# Patient Record
Sex: Female | Born: 1950 | Race: White | Hispanic: No | Smoking: Former smoker
Health system: Southern US, Community
[De-identification: ages and names within clinical notes are randomized; demographics above are authoritative.]

## PROBLEM LIST (undated history)

## (undated) DIAGNOSIS — I10 Essential (primary) hypertension: Secondary | ICD-10-CM

## (undated) DIAGNOSIS — E7800 Pure hypercholesterolemia, unspecified: Secondary | ICD-10-CM

## (undated) DIAGNOSIS — I999 Unspecified disorder of circulatory system: Secondary | ICD-10-CM

## (undated) HISTORY — DX: Pure hypercholesterolemia, unspecified: E78.00

## (undated) HISTORY — DX: Essential (primary) hypertension: I10

## (undated) HISTORY — PX: FEMORAL EMBOLOECTOMY: SHX1584

## (undated) HISTORY — PX: ENDARTERECTOMY: SHX5162

## (undated) HISTORY — DX: Unspecified disorder of circulatory system: I99.9

## (undated) HISTORY — PX: ANGIOPLASTY: SHX39

## (undated) HISTORY — PX: OTHER SURGICAL HISTORY: SHX169

---

## 1998-07-15 ENCOUNTER — Other Ambulatory Visit: Admission: RE | Admit: 1998-07-15 | Discharge: 1998-07-15 | Payer: Self-pay | Admitting: Obstetrics and Gynecology

## 1999-07-16 ENCOUNTER — Other Ambulatory Visit: Admission: RE | Admit: 1999-07-16 | Discharge: 1999-07-16 | Payer: Self-pay | Admitting: Obstetrics and Gynecology

## 1999-08-04 ENCOUNTER — Ambulatory Visit (HOSPITAL_COMMUNITY): Admission: RE | Admit: 1999-08-04 | Discharge: 1999-08-04 | Payer: Self-pay | Admitting: Obstetrics and Gynecology

## 1999-08-04 ENCOUNTER — Encounter: Payer: Self-pay | Admitting: Obstetrics and Gynecology

## 2000-08-09 ENCOUNTER — Other Ambulatory Visit: Admission: RE | Admit: 2000-08-09 | Discharge: 2000-08-09 | Payer: Self-pay | Admitting: Obstetrics and Gynecology

## 2001-04-06 ENCOUNTER — Other Ambulatory Visit: Admission: RE | Admit: 2001-04-06 | Discharge: 2001-04-06 | Payer: Self-pay | Admitting: Obstetrics and Gynecology

## 2001-04-06 ENCOUNTER — Encounter (INDEPENDENT_AMBULATORY_CARE_PROVIDER_SITE_OTHER): Payer: Self-pay

## 2003-02-13 ENCOUNTER — Other Ambulatory Visit: Admission: RE | Admit: 2003-02-13 | Discharge: 2003-02-13 | Payer: Self-pay | Admitting: Obstetrics and Gynecology

## 2010-12-05 HISTORY — PX: HIP SURGERY: SHX245

## 2010-12-30 ENCOUNTER — Ambulatory Visit (HOSPITAL_COMMUNITY)
Admission: RE | Admit: 2010-12-30 | Discharge: 2010-12-30 | Payer: Self-pay | Source: Home / Self Care | Attending: Cardiology | Admitting: Cardiology

## 2011-01-10 ENCOUNTER — Ambulatory Visit (HOSPITAL_COMMUNITY)
Admission: RE | Admit: 2011-01-10 | Discharge: 2011-01-10 | Disposition: A | Discharge status: Home / Self Care | Payer: BC Managed Care – PPO | Source: Ambulatory Visit | Attending: Cardiology | Admitting: Cardiology

## 2011-01-10 DIAGNOSIS — I2510 Atherosclerotic heart disease of native coronary artery without angina pectoris: Secondary | ICD-10-CM | POA: Insufficient documentation

## 2011-01-10 DIAGNOSIS — E785 Hyperlipidemia, unspecified: Secondary | ICD-10-CM | POA: Insufficient documentation

## 2011-01-10 DIAGNOSIS — Z87891 Personal history of nicotine dependence: Secondary | ICD-10-CM | POA: Insufficient documentation

## 2011-01-10 DIAGNOSIS — I10 Essential (primary) hypertension: Secondary | ICD-10-CM | POA: Insufficient documentation

## 2011-01-11 ENCOUNTER — Ambulatory Visit (HOSPITAL_COMMUNITY): Admission: RE | Admit: 2011-01-11 | Payer: BC Managed Care – PPO | Source: Ambulatory Visit | Admitting: Cardiology

## 2011-01-20 NOTE — Procedures (Signed)
[name], [name]              ACCOUNT NO.:  [id number]  MEDICAL RECORD NO.:  [id number]           PATIENT TYPE:  O  LOCATION:  [facility]                         FACILITY:  [facility]  PHYSICIAN:  Vonna Kotyk R. Jacinto Halim, MD       DATE OF BIRTH:  Feb 04, 1951  DATE OF PROCEDURE:  01/10/2011 DATE OF DISCHARGE:  01/10/2011                   PERIPHERAL VASCULAR INVASIVE PROCEDURE   REFERRING PHYSICIAN:  Dr. Ardeen Garland.  OPERATION PERFORMED:  Left heart catheterization including, 1. Left ventriculography. 2. Selective right and left coronary arteriography. 3. Abdominal aortogram. 4. Right iliac arteriogram with femoral runoff. 5. Left iliac arteriogram with distal runoff.  INDICATIONS:  [name] is a 60 year old female with history of hypertension, which is very difficult to control, hyperlipidemia, tobacco use which she quit after my office visit 2 weeks ago who has had right foot ulcers.  These have been slowly healing.  Because of symptoms suggestive of severe peripheral arterial disease, she had undergone outpatient Doppler evaluation, which revealed occlusion of right common femoral artery and literally no ABI obtainable on the right leg.  Left lower extremity ABI was 0.7.  Left heart catheterization is being performed to evaluate for coronary artery disease.  Outpatient nuclear stress test had revealed the ejection fraction of 35% without any ischemia.  However, multivessel coronary artery disease was suspected.  HEMODYNAMIC DATA:  The left ventricular pressure was 135/6 with an end- diastolic of 16 mmHg.  Aortic pressure was 135/60 with a mean of 85 mmHg.  There was no pressure gradient across the aortic valve.  ANGIOGRAPHIC DATA: 1. Left ventricle.  Left ventricular systolic function was normal with     the ejection fraction of 55% to 60%.  There was no significant     mitral regurgitation. 2. Right coronary artery.  Right coronary artery is codominant with     left  circumflex coronary artery.  There is a 20% to 30% mid     stenosis.  Right coronary artery is tortuous. 3. Left main coronary artery.  Left main coronary artery is mildly     calcified, but is patent. 4. LAD.  LAD is a large-caliber vessel.  It has got diffuse mild     calcifications.  Again tortuosity was evident. 5. Circumflex coronary artery.  Circumflex coronary artery is     codominant.  The mid segment of the circumflex coronary artery     shows a 40% to 50% stenoses. 6. LAD.  LAD shows again proximal calcification, but no luminal     irregularity was evident.  The LAD is tortuous. 7. Abdominal aortogram.  Abdominal aortogram revealed presence of 2     renal arteries.  Abdominal aorta showed mild calcification.     Aortoiliac bifurcation was widely patent. 8. Right lower extremity arteriogram.  Right lower extremity     arteriogram revealed right iliac artery to be widely patent.  Right     femoral artery is occluded right at the femoral head.  The right     superficial femoral artery constitutes just outside of the high-     dose canal.  There was three-vessel runoff noted below the right  knee. 9. Left femoral artery with distal runoff.  Left femoral artery with     distal runoff revealed the left superficial femoral artery to be     widely patent with mild luminal irregularity.  The popliteal artery     and distal SFA is occluded.  This is a short segment occlusion.     There is extensive collaterals noted.  Below the left knee, there     is three-vessel runoff.  IMPRESSION: 1. No significant coronary artery disease.  Mild-to-moderate amount of     calcification is noted in the proximal coronary vessels.  The     coronary vessels are calcified.  The circumflex femoral artery is     codominant with the right coronary artery and has a mid 40% to 50%     stenosis. 2. Normal left ventricular systolic function, ejection fraction 55% to     60%. 3. Patent renal arteries. 4.  Long segment right common femoral occlusion and the right     superficial femoral artery reconstitutes above just outside of the     high-dose canal. 5. Short segment occlusion of the left distal SFA and left popliteal     artery and this is again at the level of the left knee joint. 6. Three-vessel runoff below the right and left lower extremity.  RECOMMENDATIONS:  The patient will need aggressive continued risk modification.  If she continues to have significant symptoms of claudication or if her ulcers do not heal, then she will need bypass surgery to her right lower extremity that is femoral popliteal bypass on the right.  Left lower extremity claudication can be treated possibly with atherectomy if needed for angioplasty.  TECHNIQUE OF PROCEDURE:  Under sterile precautions, using a 6-French left femoral arterial access, a 5-French Judkins left 4 diagnostic catheter was utilized to engage the left common coronary artery and angiography was performed.  The catheter was then pulled out of the body over a [employer] wire.  A Judkins right 4 diagnostic catheter was utilized to engage the coronary artery and angiography was performed.  The left ventriculography was performed using a pigtail catheter and injecting 25 mL of contrast.  Abdominal aortogram was performed using the same pigtail catheter.  20 mL of contrast was utilized.  Right femoral artery with the distal runoff.  A Omniflush catheter was utilized to place the Foley catheter into the right iliac artery.  The same catheter was also again utilized to a place the catheter in the right common femoral artery.  Right femoral arteriogram with distal runoff was performed.  Then the attention was directed towards the left lower extremity.  Left lower extremity arteriogram was performed using the left femoral access sheath, the tip of which was placed in the left iliac artery.  The catheter exchanges was done over the  [facility] wire.  I did attempt to see if the right femoral artery occlusion can easily be crossed.  I did attempt to poke the right common femoral lesion with a stiff Glidewire, but I was unable to do so.  The lesion was left alone without much attempt as it is a long segment occlusion.  Total of 250 mL of contrast was utilized for diagnostic angiography.     Cristy Hilts. Jacinto Halim, MD     JRG/MEDQ  D:  01/10/2011  T:  01/10/2011  Job:  [id number]  cc:   Ardeen Garland, MD  Electronically Signed by Yates Decamp MD on 01/20/2011 01:27:29 PM

## 2011-01-27 ENCOUNTER — Encounter (INDEPENDENT_AMBULATORY_CARE_PROVIDER_SITE_OTHER): Payer: BC Managed Care – PPO | Admitting: Vascular Surgery

## 2011-01-27 DIAGNOSIS — I7092 Chronic total occlusion of artery of the extremities: Secondary | ICD-10-CM

## 2011-01-28 NOTE — Assessment & Plan Note (Signed)
OFFICE VISIT  [name], [name] DOB:  1951/08/30                                       01/27/2011 EAVWU#:[id number]  The patient is a 60 year old female referred by Dr. Jacinto Halim for complaints of right lower extremity claudication with ulceration in her right foot. She states that she has had sores on her right foot and numbness for several months.  She experiences tightness and pain in her right calf after walking approximately 1 block.  CHRONIC MEDICAL PROBLEMS:  Include hypertension and elevated cholesterol as well as her peripheral arterial disease.  These are currently controlled and followed by Dr. Jacinto Halim.  She denies personal history of diabetes.  PAST SURGICAL HISTORY:  None.  PAST MEDICAL HISTORY:  As listed above.  SOCIAL HISTORY:  She works as a Midwife.  She is single.  She has no children.  She is a current smoker of a half pack a day and has smoked for greater than 20 years.  She states that she has not smoked in the last 3-4 weeks.  She does not consume alcohol regularly.  FAMILY HISTORY:  Remarkable for her mother who had coronary artery disease and myocardial infarction at age 70.  REVIEW OF SYSTEMS:  VASCULAR:  Pain in her legs as mentioned above. GENERAL:  She is 5 feet 7 inches, 140 pounds. ENT:  She has had some decline in her eyesight recently and also wears bilateral hearing aids. All other systems were negative.  Please see intake referral form for details.  MEDICATIONS: 1. Toprol XL 25 mg once a day. 2. Plavix 75 mg once a day. 3. Pitavastatin 2 mg once daily. 4. Tribenzor once daily. 5. Aspirin 325 mg once daily. 6. Keflex 500 mg twice a day. 7. Vitamin D3. 8. Multivitamin. 9. Oxycodone p.r.n. foot pain.  ALLERGIES:  She has no known drug allergies.  PHYSICAL EXAM:  Vital signs:  Blood pressure is 149/85 in the left arm, heart rate is 51 and regular, oxygen saturation 97% on room air.  HEENT: Unremarkable other  than the fact that she wears bilateral hearing aids. Neck:  Has 2+ carotid pulses without bruit.  Chest:  Clear to auscultation.  Cardiac:  Regular rate and rhythm without murmur. Abdomen:  Soft, nontender, nondistended.  No masses.  Musculoskeletal: She has no major obvious joint deformities.  Neurologic:  She has symmetric upper extremity and lower extremity motor strength which is 5/5.  Skin:  The right foot is rubrous from the mid foot out into the toes.  She also has a small ulceration over the right fifth metatarsal head.  Peripheral vascular exam:  She has 2+ radial pulses bilaterally. She has 2+ left femoral pulse.  She has absent right femoral pulse.  She has absent popliteal and pedal pulses in the right leg.  She has a 1+ dorsalis pedis pulse in the left foot.  I reviewed her arteriogram performed by Dr. Yates Decamp.  This shows a right common femoral artery occlusion with some reconstitution of the distal profunda femoris artery.  She also has reconstitution of the above knee popliteal artery with three vessel runoff to the right foot. Left lower extremity she has a 50% stenosis of the left popliteal artery.  Otherwise her vasculature is intact.  I believe the best option for the patient at this point would be a right femoral endarterectomy,  possible right femoral to above knee popliteal bypass to heal up the ulcer on the right foot.  She might also possibly require femoral-femoral bypass if I am unable to reopen the occluded right common femoral artery.  All these procedures were discussed with the patient today including but not limited to bleeding, infection, myocardial risk, thrombosis of the grafts long-term, possible limb loss. She understands and agrees to proceed.  This is scheduled for 02/02/2011.    Janetta Hora. Fields, MD Electronically Signed  CEF/MEDQ  D:  01/27/2011  T:  01/28/2011  Job:  [id number]  cc:   Cristy Hilts. Jacinto Halim, MD Ardeen Garland, MD

## 2011-02-01 ENCOUNTER — Other Ambulatory Visit: Payer: Self-pay | Admitting: Vascular Surgery

## 2011-02-01 ENCOUNTER — Ambulatory Visit (HOSPITAL_COMMUNITY)
Admission: RE | Admit: 2011-02-01 | Discharge: 2011-02-01 | Disposition: A | Discharge status: Home / Self Care | Payer: BC Managed Care – PPO | Source: Ambulatory Visit | Attending: Vascular Surgery | Admitting: Vascular Surgery

## 2011-02-01 ENCOUNTER — Encounter (HOSPITAL_COMMUNITY)
Admission: RE | Admit: 2011-02-01 | Discharge: 2011-02-01 | Disposition: A | Discharge status: Home / Self Care | Payer: BC Managed Care – PPO | Source: Ambulatory Visit | Attending: Vascular Surgery | Admitting: Vascular Surgery

## 2011-02-01 DIAGNOSIS — I10 Essential (primary) hypertension: Secondary | ICD-10-CM | POA: Insufficient documentation

## 2011-02-01 DIAGNOSIS — I739 Peripheral vascular disease, unspecified: Secondary | ICD-10-CM

## 2011-02-01 DIAGNOSIS — Z01810 Encounter for preprocedural cardiovascular examination: Secondary | ICD-10-CM | POA: Insufficient documentation

## 2011-02-01 DIAGNOSIS — Z01812 Encounter for preprocedural laboratory examination: Secondary | ICD-10-CM | POA: Insufficient documentation

## 2011-02-01 DIAGNOSIS — Z87891 Personal history of nicotine dependence: Secondary | ICD-10-CM | POA: Insufficient documentation

## 2011-02-01 LAB — SURGICAL PCR SCREEN
MRSA, PCR: NEGATIVE
Staphylococcus aureus: NEGATIVE

## 2011-02-01 LAB — COMPREHENSIVE METABOLIC PANEL
AST: 21 U/L (ref 0–37)
BUN: 17 mg/dL (ref 6–23)
CO2: 30 mEq/L (ref 19–32)
Calcium: 10.2 mg/dL (ref 8.4–10.5)
Chloride: 100 mEq/L (ref 96–112)
Creatinine, Ser: 1.04 mg/dL (ref 0.4–1.2)
GFR calc non Af Amer: 54 mL/min — ABNORMAL LOW (ref >60)
Glucose, Bld: 90 mg/dL (ref 70–99)
Total Bilirubin: 1 mg/dL (ref 0.3–1.2)

## 2011-02-01 LAB — CBC
Platelets: 358 10*3/uL (ref 150–400)
RBC: 4.53 MIL/uL (ref 3.87–5.11)
RDW: 12.9 % (ref 11.5–15.5)
WBC: 8.9 10*3/uL (ref 4.0–10.5)

## 2011-02-01 LAB — URINE MICROSCOPIC-ADD ON

## 2011-02-01 LAB — URINALYSIS, ROUTINE W REFLEX MICROSCOPIC
Bilirubin Urine: NEGATIVE
Nitrite: NEGATIVE
Specific Gravity, Urine: 1.013 (ref 1.005–1.030)
Urobilinogen, UA: 0.2 mg/dL (ref 0.0–1.0)
pH: 7 (ref 5.0–8.0)

## 2011-02-01 LAB — TYPE AND SCREEN
ABO/RH(D): O POS
Antibody Screen: NEGATIVE

## 2011-02-02 ENCOUNTER — Other Ambulatory Visit: Payer: Self-pay | Admitting: Vascular Surgery

## 2011-02-02 ENCOUNTER — Inpatient Hospital Stay (HOSPITAL_COMMUNITY)
Admission: RE | Admit: 2011-02-02 | Discharge: 2011-02-04 | DRG: 479 | Disposition: A | Discharge status: Home / Self Care | Payer: BC Managed Care – PPO | Source: Ambulatory Visit | Attending: Vascular Surgery | Admitting: Vascular Surgery

## 2011-02-02 DIAGNOSIS — F17200 Nicotine dependence, unspecified, uncomplicated: Secondary | ICD-10-CM | POA: Diagnosis present

## 2011-02-02 DIAGNOSIS — I739 Peripheral vascular disease, unspecified: Secondary | ICD-10-CM | POA: Diagnosis present

## 2011-02-02 DIAGNOSIS — I743 Embolism and thrombosis of arteries of the lower extremities: Secondary | ICD-10-CM

## 2011-02-02 DIAGNOSIS — E785 Hyperlipidemia, unspecified: Secondary | ICD-10-CM | POA: Diagnosis present

## 2011-02-02 DIAGNOSIS — I10 Essential (primary) hypertension: Secondary | ICD-10-CM | POA: Diagnosis present

## 2011-02-02 DIAGNOSIS — I959 Hypotension, unspecified: Secondary | ICD-10-CM | POA: Diagnosis present

## 2011-02-02 DIAGNOSIS — Z7982 Long term (current) use of aspirin: Secondary | ICD-10-CM

## 2011-02-02 DIAGNOSIS — I498 Other specified cardiac arrhythmias: Secondary | ICD-10-CM | POA: Diagnosis present

## 2011-02-02 DIAGNOSIS — E7800 Pure hypercholesterolemia, unspecified: Secondary | ICD-10-CM | POA: Diagnosis present

## 2011-02-02 DIAGNOSIS — I4891 Unspecified atrial fibrillation: Secondary | ICD-10-CM | POA: Diagnosis present

## 2011-02-03 DIAGNOSIS — Z48812 Encounter for surgical aftercare following surgery on the circulatory system: Secondary | ICD-10-CM

## 2011-02-03 DIAGNOSIS — I739 Peripheral vascular disease, unspecified: Secondary | ICD-10-CM

## 2011-02-03 LAB — BASIC METABOLIC PANEL
BUN: 11 mg/dL (ref 6–23)
CO2: 27 mEq/L (ref 19–32)
Calcium: 9 mg/dL (ref 8.4–10.5)
Glucose, Bld: 107 mg/dL — ABNORMAL HIGH (ref 70–99)
Potassium: 3.7 mEq/L (ref 3.5–5.1)
Sodium: 137 mEq/L (ref 135–145)

## 2011-02-03 LAB — CBC
HCT: 29.9 % — ABNORMAL LOW (ref 36.0–46.0)
Hemoglobin: 10 g/dL — ABNORMAL LOW (ref 12.0–15.0)
MCHC: 33.4 g/dL (ref 30.0–36.0)
MCV: 84.5 fL (ref 78.0–100.0)

## 2011-02-03 LAB — HEPARIN LEVEL (UNFRACTIONATED): Heparin Unfractionated: <0.1 IU/mL — ABNORMAL LOW (ref 0.30–0.70)

## 2011-02-04 LAB — PROTEIN C ACTIVITY: Protein C Activity: 142 % — ABNORMAL HIGH (ref 75–133)

## 2011-02-04 LAB — LUPUS ANTICOAGULANT PANEL
PTT Lupus Anticoagulant: 47.7 secs — ABNORMAL HIGH (ref 30.0–45.6)
PTTLA 4:1 Mix: 42.6 secs (ref 30.0–45.6)

## 2011-02-04 LAB — HOMOCYSTEINE: Homocysteine: 12 umol/L (ref 4.0–15.4)

## 2011-02-06 LAB — PROTEIN S, TOTAL: Protein S Ag, Total: 97 % (ref 70–140)

## 2011-02-07 NOTE — Discharge Summary (Addendum)
[name], [name]              ACCOUNT NO.:  [id number]  MEDICAL RECORD NO.:  [id number]           PATIENT TYPE:  I  LOCATION:  2039                         FACILITY:  [facility]  PHYSICIAN:  Janetta Hora. Fields, MD  DATE OF BIRTH:  July 25, 1951  DATE OF ADMISSION:  02/02/2011 DATE OF DISCHARGE:  02/04/2011                              DISCHARGE SUMMARY   CHIEF COMPLAINT:  Right lower extremity claudication with ulceration in the right foot.  HISTORY OF PRESENT ILLNESS:  [name] was referred to [place] by Dr. Jacinto Halim regarding complaints of right lower extremity claudication and ulceration of the right foot.  She states she had sores on the right foot and numbness for several months.  She experienced tightness and pain in her right calf after walking approximately one block.  Angiogram performed by Dr. Jacinto Halim showed a right common femoral artery occlusion, some reconstitution of distal profunda femoris artery.  She also had reconstitution of the above-knee popliteal artery with three-vessel runoff to the right foot.  On the left lower extremity, she had a 50% stenosis of the left popliteal artery.  She was admitted to the hospital for a right common femoral artery and external iliac endarterectomy, right common femoral embolectomy with bovine patch angioplasty.  PAST MEDICAL HISTORY: 1. Hypertension. 2. Hypercholesterolemia. 3. Peripheral vascular disease. 4. She denies any coronary artery disease or diabetes. 5. She has a positive history of smoking 1/2 a pack a day for greater     than 20 years.  HOSPITAL COURSE:  The patient was taken to the operating room on February 02, 2011, for a right common femoral and external iliac artery endarterectomies with right common femoral embolectomy and with bovine patch angioplasty.  Postoperatively, the patient did well.  Her foot was warm and pink.  She was started on Pradaxa and taken off her Plavix. She will continue on her aspirin.  Her  laboratory values remained stable.  Her ABIs showed 0.62 on the right, 0.85 on the left; and she remained stable from both a cardiac and vascular standpoint.  She was up and about walking without difficulty.  Foot much improved and she had referral for an outpatient orthotist to custom make a shoe to relieve any pressure on the source of her foot.  She will be discharged to home and follow up with Dr. Darrick Penna in 2-3 weeks and she will follow up with Dr. Jacinto Halim regarding her Pradaxa and aspirin regimen.  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  She is status post a right common femoral and right external iliac endarterectomies with bovine patch angioplasty and embolectomy of her right common femoral artery.  She had a hypercoagulable workup sent which is still pending.  The patient was begun on Pradaxa and 81 mg of aspirin to prevent further embolus.  She had some PACs, but no atrial fib.  DISPOSITION:  The patient was discharged to home.  She will follow up with Dr. Darrick Penna in 2-3 weeks and with Dr. Jacinto Halim in 2 weeks.  DISCHARGE MEDICATIONS: 1. Pradaxa 150 mg twice daily. 2. Oxycodone 5 mg 1-2 tablets every  4 hours as needed for pain, she     was given 30. 3. Aspirin 81 mg daily. 4. Livalo 2 mg daily. 5. Multivitamin daily. 6. Toprol-XL mg daily. 7. Tribenzor 10/12.5/40 mg daily. 8. Vitamin D by mouth daily.     Della Goo, PA-C   ______________________________ Janetta Hora Fields, MD    RR/MEDQ  D:  02/04/2011  T:  02/05/2011  Job:  [id number]  Electronically Signed by Fabienne Bruns MD on 02/07/2011 11:03:35 AM Electronically Signed by Della Goo PA on 02/07/2011 02:42:23 PM

## 2011-02-07 NOTE — Op Note (Signed)
[name], [name]              ACCOUNT NO.:  [id number]  MEDICAL RECORD NO.:  [id number]           PATIENT TYPE:  I  LOCATION:  3303                         FACILITY:  [facility]  PHYSICIAN:  Janetta Hora. Orlin Kann, MD  DATE OF BIRTH:  Oct 08, 1951  DATE OF PROCEDURE:  02/02/2011 DATE OF DISCHARGE:                              OPERATIVE REPORT   PROCEDURES: 1. Right femoral embolectomy 2. Endarterectomy of right external iliac and common femoral artery. 3. Right profundoplasty. 4. Patch angioplasty of right femoral artery.  PREOPERATIVE DIAGNOSIS:  Nonhealing ulcer, right foot.  POSTOPERATIVE DIAGNOSIS:  Nonhealing ulcer, right foot.  ANESTHESIA:  General.  SURGEON:  Jimia Gentles E. Makaia Rappa, MD  ASSISTANT:  Della Goo, PA-C  OPERATIVE FINDINGS: 1. Chronic embolus, right femoral artery. 2. Chronic occlusion, right superficial femoral artery. 3. Bovine pericardial patch, right femoral artery.  OPERATIVE DETAILS:  After obtaining informed consent, the patient was taken to the operating room.  The patient was placed in supine position on the operating room table.  The patient's entire right lower extremity was prepped and draped in the usual sterile fashion as well as the left groin and thigh area.  This was all done after induction of general anesthesia and endotracheal intubation.  Next, a longitudinal incision was made in the right groin, carried down through the subcutaneous tissues down to the level of the right common femoral artery.  The femoral artery was densely adherent to the surrounding structures and dissection of this was fairly tedious.  There seemed to be inflammatory reaction around this.  There was no pulse within the right femoral artery.  Preoperative arteriogram had showed occlusion of the distal right external iliac artery as well as the right common femoral artery with reconstitution of the above-knee popliteal artery.  The profunda femoris and superficial  femoral arteries were dissected free circumferentially and vessel loops were placed around these. Common femoral artery was dissected free circumferentially.  To get to an area of the artery where there was a reasonable pulse, this required fairly extensive dissection up underneath the right inguinal ligament. Approximately 2 cm of the right inguinal ligament was taken down with cautery.  I was then able to get up to the level of the circumflex iliac branches, all of which were ligated and divided between the silk ties. Several centimeters above the inguinal ligament, the external iliac artery was normal in appearance and had a good quality pulse.  At this point, the patient was given 7000 units of intravenous heparin.  The patient was given an additional 2000 units of heparin during the course of the case.  A longitudinal opening was then made after occluding the right common femoral artery with a Cooley clamp and the profunda and superficial femoral arteries with vessel loops.  There was adherent thrombus within the femoral artery.  The arteriotomy was extended several centimeters in the common femoral artery and then the thrombus was removed under direct vision.  This was very chronic and was adherent to the arterial wall.  Next, a #4 Fogarty catheter was used to thrombectomized the inflow through the external iliac artery.  More clot  was returned.  Several representative pieces of this were sent to pathology as a specimen.  The #4 and #5 Fogarty catheters were used and multiple passes were made up the inflow system until two clean passes were obtained.  This was then re-occluded with the Cooley clamp. Attention was then turned to the distal femoral artery.  The clot again was very adherent, there was not really any focal calcification or plaque; however, the clot was adherent to the wall of the artery.  This required extending the arteriotomy down into the profunda femoris artery.  I  was then able to pass a #3 Fogarty down into two large branches of the profunda and again this was passed until two clean passes were obtained, much of this clot was actually removed under direct vision again because it was so adherent to the wall of the artery.  There was good backbleeding from the profunda femoris at this time and this was thoroughly flushed with heparinized saline and re- occluded with a baby Gregory clamp.  Attempts were then made to thrombectomized the superficial femoral artery.  I was able to get a #4 Fogarty catheter to pass 20 cm down into the superficial femoral artery and returned some clot; however, there was no backbleeding from the artery.  The arteriogram had the appearance of a chronic SFA occlusion distally.  At this point, all loose debris was removed from the femoral artery.  I also did a right femoral endarterectomy and endarterectomized the part of the external iliac artery, so that there was a nice smooth surface as the intima had been fairly severely damaged by the adherent clot.  After all loose debris was removed, the bovine pericardial patch was brought up in the operative field and this was used so on as a patch angioplasty extending from the distal external iliac artery all the way down several centimeters onto the right profunda femoris artery.  Just prior to completion of the anastomosis, everything was forebled, backbled and thoroughly flushed.  Anastomosis was secured.  Clamps were released.  There was good pulsatile flow in the femoral artery immediately.  There was good biphasic-to-triphasic Doppler flow in the profunda femoris artery.  There was monophasic flow in the right superficial femoral artery.  The patient had good biphasic dorsalis pedis and monophasic posterior tibial Doppler signals at this point. Hemostasis was obtained with addition of two repair stitches in the patch.  Next, the groin was closed in multiple layers of running  2-0 and 3-0 Vicryl suture.  The inguinal ligament was repaired with several 2-0 Vicryl sutures.  Subcutaneous tissues were reapproximated using running 3-0 Vicryl suture.  The skin was closed with a 4-0 Vicryl subcuticular stitch and Dermabond applied.  The patient tolerated the procedure well and there were no complications.  Instrument, sponge, and needle counts were correct at the end of the case.  The patient was taken to the recovery room in stable condition.     Janetta Hora. Keithen Capo, MD     CEF/MEDQ  D:  02/02/2011  T:  02/03/2011  Job:  [id number]  Electronically Signed by Fabienne Bruns MD on 02/07/2011 11:03:29 AM

## 2011-02-08 LAB — BETA-2-GLYCOPROTEIN I ABS, IGG/M/A
Beta-2 Glyco I IgG: 37 G Units — ABNORMAL HIGH (ref <20)
Beta-2-Glycoprotein I IgA: 11 A Units (ref <20)

## 2011-02-08 LAB — CARDIOLIPIN ANTIBODIES, IGG, IGM, IGA: Anticardiolipin IgA: 5 APL U/mL — ABNORMAL LOW (ref <22)

## 2011-02-17 ENCOUNTER — Encounter: Payer: BC Managed Care – PPO | Admitting: Vascular Surgery

## 2011-02-17 ENCOUNTER — Ambulatory Visit (INDEPENDENT_AMBULATORY_CARE_PROVIDER_SITE_OTHER): Payer: BC Managed Care – PPO | Admitting: Vascular Surgery

## 2011-02-17 DIAGNOSIS — L98499 Non-pressure chronic ulcer of skin of other sites with unspecified severity: Secondary | ICD-10-CM

## 2011-02-17 DIAGNOSIS — I739 Peripheral vascular disease, unspecified: Secondary | ICD-10-CM

## 2011-02-18 NOTE — Assessment & Plan Note (Signed)
OFFICE VISIT  ANGLA, DELAHUNT DOB:  [birth year]                                       02/17/2011 EAVWU#:[id number]  The patient returns for followup today for a wound check in her right groin.  She was apparently seen by Dr. Jacinto Halim a few days ago and he had some concerns whether or not she may have infection in there.  She denies any fever or chills.  She denies any drainage from the wound. She is currently not on antibiotics.  PHYSICAL EXAM:  Blood pressure is 162/92 in the left arm, heart rate 66 and regular.  Right groin incision is healing well except for the very central portion which has some maceration where the skin edges have been rubbing together.  There is no significant erythema.  There is no purulent drainage.  There is no mass in this area.  Her right foot is pink and warm.  She does still have an ulcer over the fifth metatarsal head which is occasionally painful to her but it does appear like this is drying up.  In summary, I believe the patient's wound is continuing to heal well at this point.  I do not believe she needs antibiotic therapy or any incision and drainage of her right groin incision.  I did counsel her today in keeping this dry and washing this once daily with soap and water.  She will follow up next week for further followup.  Will also obtain ABIs at that time to make sure that she does have adequate perfusion at this point to heal up her right foot ulcer.    Janetta Hora. Fields, MD Electronically Signed  CEF/MEDQ  D:  02/17/2011  T:  02/18/2011  Job:  [id number]  cc:   Cristy Hilts. Jacinto Halim, MD

## 2011-02-24 ENCOUNTER — Ambulatory Visit (INDEPENDENT_AMBULATORY_CARE_PROVIDER_SITE_OTHER): Payer: BC Managed Care – PPO | Admitting: Vascular Surgery

## 2011-02-24 ENCOUNTER — Ambulatory Visit: Payer: Self-pay | Admitting: Vascular Surgery

## 2011-02-24 ENCOUNTER — Encounter: Payer: BC Managed Care – PPO | Admitting: Vascular Surgery

## 2011-02-24 ENCOUNTER — Encounter (INDEPENDENT_AMBULATORY_CARE_PROVIDER_SITE_OTHER): Payer: BC Managed Care – PPO

## 2011-02-24 DIAGNOSIS — I739 Peripheral vascular disease, unspecified: Secondary | ICD-10-CM

## 2011-02-24 DIAGNOSIS — I743 Embolism and thrombosis of arteries of the lower extremities: Secondary | ICD-10-CM

## 2011-02-24 DIAGNOSIS — Z48812 Encounter for surgical aftercare following surgery on the circulatory system: Secondary | ICD-10-CM

## 2011-02-25 NOTE — Assessment & Plan Note (Signed)
OFFICE VISIT  HAIZEL, GATCHELL DOB:  1951/07/24                                       02/24/2011 WUXLK#:[id number]  The patient returns for followup today.  She recently had thrombectomy and femoral endarterectomy on the right side.  She returns today for a wound check.  She also returns today to check to see what the perfusion in her right leg is at this point.  She states that she still has some mild pain in her right foot but this continues to improve.  The ulcer on her right fifth metatarsal has also continued to heal.  She is planning on returning to work next week.  She has had no significant drainage from the right groin.  PHYSICAL EXAM:  Vital signs:  Blood pressure is 182/80 in the left arm, heart rate 70 and regular.  Temperature is 97.9.  Right groin incision is continuing to heal well.  There is no drainage.  There is no erythema.  The right foot is pink and warm.  The ulceration over the right fifth metatarsal head is 2 cm in diameter.  It is dry.  There is no tenderness.  There is no purulent drainage from this.  Her ABIs were rechecked today and she has gone from an ABI on the right side of 0 to 0.58.  Left ABI was 0.89.  I would favor continued conservative management.  She should have adequate perfusion now to heal the right foot.  We will give her another 6 weeks to heal this.  If it has not healed at that time or she still is experiencing difficulty in the right foot we would consider whether or not further revascularization in the right leg is required.  She will continue to do dry dressings on the right groin as needed.    Janetta Hora. Fields, MD Electronically Signed  CEF/MEDQ  D:  02/24/2011  T:  02/25/2011  Job:  [id number]  cc:   Cristy Hilts. Jacinto Halim, MD

## 2011-02-28 NOTE — Consult Note (Signed)
[name], [name]              ACCOUNT NO.:  [id number]  MEDICAL RECORD NO.:  [id number]          PATIENT TYPE:  INP  LOCATION:                               FACILITY:  [facility]  PHYSICIAN:  Vonna Kotyk R. Jacinto Halim, MD       DATE OF BIRTH:  10/30/1951  DATE OF CONSULTATION:  02/01/2011 DATE OF DISCHARGE:                                CONSULTATION   REASON FOR CONSULTATION:  Please evaluate SVT, possible atrial fibrillation, and atrial tachycardia.  HISTORY:  [name] is a pleasant 60 year old female with past history of difficult to control hypertension, tobacco use, which she quit in January 2012, underwent lower extremity arteriogram, which revealed long segment occlusion of the right external iliac artery, common femoral artery, and also long segment occlusion of the right superficial femoral artery.  She had ulcerations in the right foot hence with a limb threatening ischemia.  She was referred for Vascular consultation with Dr. Fabienne Bruns for evaluation of possible aortofemoral bypass surgery.  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  Dr. Fabienne Bruns was able to successfully perform thrombectomy of the external iliac and common femoral artery along with profunda femoral artery and she underwent patch angioplasty of the same. Because of presence of thrombotic occlusion, I was asked to plan on presence of frequent atrial ectopic rhythm and possible consideration for anticoagulation.  Presently, the patient denies any complaints.  She states that her feet feels warm for the first time.  She denies any chest pain, shortness of breath, paroxysmal nocturnal dyspnea, or orthopnea.  REVIEW OF SYSTEMS:  She has not had any significant bowel or bladder disturbances.  No fever.  No nausea or vomiting.  She is not a diabetic. She has remained abstinent from smoking.   Other systems are negative.  PRESENT MEDICATIONS:  Includes labetalol and hydralazine for p.r.n. basis for hypertension.  ALLERGIES:  No known drug allergies.  PAST MEDICAL HISTORY:  Significant for hypertension, hyperlipidemia, and peripheral arterial disease.  PAST SURGICAL HISTORY:  None apart from what happened today.  SOCIAL HISTORY:  She is single.  She does not drink alcohol.  She has quit smoking since January 2012.  FAMILY HISTORY:  There is no history of premature coronary artery disease in the family.  PHYSICAL EXAM:  GENERAL:  She is moderately built, well nourished, appears to be in no acute distress. Vital Signs:  Include temperature of 97.7, pulse is 70 beats per minute, respirations 12, blood pressure 147/76 mmHg. CARDIAC:  S1 and S2 is normal without any gallop or murmur. CHEST:  Clear. ABDOMEN:  Soft.  Her surgical site appears to have no obvious ooze. EXTREMITIES:  Her right foot is warm, dry.  There is full range of movement.  There is no edema.  The other 3 extremities are normal.  Her telemetry evaluation revealed presence of frequent atrial ectopics, but without any evidence of SVT.  Her EKG demonstrates sinus rhythm, poor R-wave progression, left ventricular hypertrophy with repolarization abnormality, unchanged from prior EKG done in the office.  IMPRESSION: 1. 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  Status post successful thrombectomy and Dacron patch     angioplasty of the external iliac and common femoral artery by Dr.     Fabienne Bruns on February 02, 2011. 2. Frequent premature atrial contractions on the monitor, but without     any evidence of documented atrial fibrillation.  By echocardiogram     that was done recently in our office had revealed left atrial size     to be mildly enlarged, which can be attributed to her marked      difficult to control hypertension.  This was done on December 29, 2002.  She has normal ejection fraction and normal coronary     arteries. 3. Hypertension, which is difficult to control.  RECOMMENDATIONS:  I agree with Dr. Darrick Penna either Pradaxa or Xarelto would be appropriate antithrombotic agent for at least 8 to 12 weeks until stable or until possible close observation for paroxysmal atrial fibrillation is ruled out.  I would probably start IV heparin only and agree with Dr. Eather Colas orders, and in 24 to 48 hours if there is no bleeding complication, then start direct thrombin inhibitor.  I would also recommend restarting all her cardiac medications as her hypertension is extremely difficult to control.  She has extremely difficult to control hypertension, hence would like to stop her previous medications.  Thank you for consultation.  I will continue to follow the patient along with you.  I greatly appreciate your care and concern for the patient.     Cristy Hilts. Jacinto Halim, MD     JRG/MEDQ  D:  02/02/2011  T:  02/03/2011  Job:  [id number]  Electronically Signed by Yates Decamp MD on 02/28/2011 09:51:06 AM

## 2011-03-21 ENCOUNTER — Encounter (HOSPITAL_BASED_OUTPATIENT_CLINIC_OR_DEPARTMENT_OTHER): Payer: BC Managed Care – PPO | Attending: Internal Medicine

## 2011-03-21 DIAGNOSIS — I10 Essential (primary) hypertension: Secondary | ICD-10-CM | POA: Insufficient documentation

## 2011-03-21 DIAGNOSIS — Z79899 Other long term (current) drug therapy: Secondary | ICD-10-CM | POA: Insufficient documentation

## 2011-03-21 DIAGNOSIS — Z7982 Long term (current) use of aspirin: Secondary | ICD-10-CM | POA: Insufficient documentation

## 2011-03-21 DIAGNOSIS — I739 Peripheral vascular disease, unspecified: Secondary | ICD-10-CM | POA: Insufficient documentation

## 2011-03-21 DIAGNOSIS — L97509 Non-pressure chronic ulcer of other part of unspecified foot with unspecified severity: Secondary | ICD-10-CM | POA: Insufficient documentation

## 2011-03-24 ENCOUNTER — Other Ambulatory Visit: Payer: Self-pay | Admitting: Otolaryngology

## 2011-03-30 ENCOUNTER — Other Ambulatory Visit: Payer: Self-pay

## 2011-03-31 ENCOUNTER — Ambulatory Visit (INDEPENDENT_AMBULATORY_CARE_PROVIDER_SITE_OTHER): Payer: BC Managed Care – PPO | Admitting: Vascular Surgery

## 2011-03-31 DIAGNOSIS — I743 Embolism and thrombosis of arteries of the lower extremities: Secondary | ICD-10-CM

## 2011-04-01 NOTE — Assessment & Plan Note (Signed)
OFFICE VISIT  [name], [name] DOB:  03/31/1951                                       03/31/2011 [id number]#:[id number]  The patient returns for followup today.  She had thrombectomy and femoral endarterectomy on the right side February 29.  She returns today for further followup.  She does not really have any claudication symptoms at this point.  She still has some pain and an ulceration over the right fifth metatarsal head.  Her groin incision is completely healed at this point.  PHYSICAL EXAM:  Blood pressure is 180/98 in the left arm, heart rate is 90 and regular.  Right groin incision is well-healed.  She has a 2 mm ulceration over the right lateral fifth malleolus.  Foot is pink, warm and well-perfused.  Previous postop ABIs on the right side indicated an ABI of 0.58.  This should be adequate for wound healing.  However, the ulcer has been persistent.  She is currently going to the wound care center and states that some progress has been made and the wound is apparently healing.  I believe the best option at this point is to see if this wound will continue to heal with conservative measures alone.  She will follow up with me in 6 weeks' time.  If the ulcer has not completely healed at that time she will need an arteriogram to reevaluate her right lower extremity and consider whether further revascularization is necessary.    Janetta Hora. Davaughn Hillyard, MD Electronically Signed  CEF/MEDQ  D:  03/31/2011  T:  04/01/2011  Job:  [id number]  cc:   Cristy Hilts. Jacinto Halim, MD

## 2011-04-07 ENCOUNTER — Ambulatory Visit: Payer: BC Managed Care – PPO | Admitting: Vascular Surgery

## 2011-04-11 ENCOUNTER — Encounter (HOSPITAL_BASED_OUTPATIENT_CLINIC_OR_DEPARTMENT_OTHER): Payer: BC Managed Care – PPO | Attending: Internal Medicine

## 2011-04-11 DIAGNOSIS — Z7982 Long term (current) use of aspirin: Secondary | ICD-10-CM | POA: Insufficient documentation

## 2011-04-11 DIAGNOSIS — I739 Peripheral vascular disease, unspecified: Secondary | ICD-10-CM | POA: Insufficient documentation

## 2011-04-11 DIAGNOSIS — Z79899 Other long term (current) drug therapy: Secondary | ICD-10-CM | POA: Insufficient documentation

## 2011-04-11 DIAGNOSIS — L97509 Non-pressure chronic ulcer of other part of unspecified foot with unspecified severity: Secondary | ICD-10-CM | POA: Insufficient documentation

## 2011-04-11 DIAGNOSIS — I10 Essential (primary) hypertension: Secondary | ICD-10-CM | POA: Insufficient documentation

## 2011-04-18 ENCOUNTER — Other Ambulatory Visit (HOSPITAL_BASED_OUTPATIENT_CLINIC_OR_DEPARTMENT_OTHER): Payer: Self-pay | Admitting: Internal Medicine

## 2011-04-18 ENCOUNTER — Ambulatory Visit (HOSPITAL_COMMUNITY)
Admission: RE | Admit: 2011-04-18 | Discharge: 2011-04-18 | Disposition: A | Discharge status: Home / Self Care | Payer: BC Managed Care – PPO | Source: Ambulatory Visit | Attending: Internal Medicine | Admitting: Internal Medicine

## 2011-04-18 DIAGNOSIS — I998 Other disorder of circulatory system: Secondary | ICD-10-CM | POA: Insufficient documentation

## 2011-04-18 DIAGNOSIS — L97509 Non-pressure chronic ulcer of other part of unspecified foot with unspecified severity: Secondary | ICD-10-CM | POA: Insufficient documentation

## 2011-04-18 DIAGNOSIS — M79609 Pain in unspecified limb: Secondary | ICD-10-CM | POA: Insufficient documentation

## 2011-04-18 DIAGNOSIS — M79673 Pain in unspecified foot: Secondary | ICD-10-CM

## 2011-04-18 DIAGNOSIS — M19079 Primary osteoarthritis, unspecified ankle and foot: Secondary | ICD-10-CM | POA: Insufficient documentation

## 2011-04-19 NOTE — Assessment & Plan Note (Signed)
[facility]  NAME:  [name], [name]              ACCOUNT NO.:  [id number]  MEDICAL RECORD NO.:  [id number]      DATE OF BIRTH:  [birth year]  PHYSICIAN:  Jonelle Sports. Klea Nall, M.D.  VISIT DATE:  03/21/2011                                  OFFICE VISIT   HISTORY:  This 60 year old white female is referred from the cardiac vascular surgeons for assistance with management of the wound on the lateral distal right foot at approximately the MP joint area.  This wound has been present since September last year and led to extensive evaluation which showed right superficial femoral artery occlusion and some abnormalities.  Eventually, she underwent embolectomy of that extremity and some angioplasty as well.  She initially appeared to have a good result but that was not sustained.  It was the hope of the vascular surgeons that this wound would nonetheless heal with careful attention and that if it had not healed within 6 weeks or so that consideration of bypass surgery would need to be undertaken. Accordingly, she appears here for our evaluation and care.  The procedure referred to above was done by Dr. Fabienne Bruns on February 02, 2011, and included right femoral embolectomy, endarterectomy of the right external iliac and common femoral artery, right profundoplasty, and a patch angioplasty of the right femoral artery.  PAST MEDICAL HISTORY:  Surgery only that as indicated above.  Other hospitalizations none.  REGULAR MEDICATIONS: 1. Pradaxa 150 mg every 12 hours. 2. Aspirin 81 mg daily. 3. Livalo 2 mg daily. 4. Multivitamin daily. 5. Tribenzor 10/40/12.5 daily. 6. Vitamin D 5000 units daily. 7. Cilostazol 100 mg twice daily.  She has no known medicinal allergies.  FAMILY HISTORY:  Positive for congestive heart failure, high blood pressure, and heart attack, as well as diabetes.  PERSONAL HISTORY:  The patient is single, lives alone, able to take care of all her  personal needs.  She quit smoking in late January 2012, but had smoked for a number of years prior to that.  She denies use of alcohol or recreational drugs.  She is employed by [employer] and [employer].  REVIEW OF SYSTEMS:  The patient has no diabetes, no history of cancer, has never received radiation or chemotherapy, has normal renal function, has never been considered for dialysis.  She of course has known peripheral artery disease as indicated above.  She denied any history associated with her history of smoking and indeed had a normal chest x- ray at the time of her procedure.  She has had extensive cardiovascular evaluation, and on the bottom line her coronary artery status is thought to be quite normal and her left ventricular ejection fraction 50%.  She has never had a stroke or any other neurologic symptomatology.  PHYSICAL EXAMINATION:  Blood pressure is 142/82, pulse 82 with occasional PC, respirations 19, temperature 98.0.  She is a thin, alert, cooperative, late middle-aged white female in no immediate distress. Her mucous membranes are moist.  Her color is normal.  She has no carotid bruits.  Her neck veins are flat.  Her chest is grossly clear to auscultation and percussion.  Her heart tones are normal with an occasional PC.  There is no definite murmur or gallop.  Her abdomen is scaphoid without organomegaly,  masses, or tenderness.  Her extremities are free of edema.  Distal pulses are not easily palpable, and there is some dependent rubor in both feet.  On the lateral aspect of her right foot proximally overlying adjacent to the metatarsal head is a small ulcer measuring 0.6 x 0.5 x 0.2 cm which is quite tender to touch but which does not appear infected and has no drainage or other striking features.  IMPRESSION:  Peripheral vascular disease with ischemic ulcer lateral aspect right foot.  DISPOSITION:  The wound today requires minimal debridement of small amount of  slough in its base.  It was then treated with an application of silver collagen covered by Tielle dressing, and the patient was instructed to change this 3 days hence and then to appear again for reevaluation in 1 week.          ______________________________ Jonelle Sports. Cheryll Cockayne, M.D.     RES/MEDQ  D:  04/18/2011  T:  04/19/2011  Job:  [id number]

## 2011-05-12 ENCOUNTER — Ambulatory Visit (INDEPENDENT_AMBULATORY_CARE_PROVIDER_SITE_OTHER): Payer: BC Managed Care – PPO | Admitting: Vascular Surgery

## 2011-05-12 DIAGNOSIS — I7092 Chronic total occlusion of artery of the extremities: Secondary | ICD-10-CM

## 2011-05-13 NOTE — Assessment & Plan Note (Signed)
OFFICE VISIT  [name], [name] DOB:  1951-02-25                                       05/12/2011 [id number]#:[id number]  The patient returns for followup today.  She previously underwent an extensive right external iliac common femoral endarterectomy with profundoplasty and femoral embolectomy with patch angioplasty of her right femoral artery on 02/02/2011.  She has been still experiencing some mild claudication symptoms after the operation.  She also has still had some difficulty healing up a small ulceration on her right foot. She was discharged from the wound center recently but the ulcer has recently broken down somewhat.  She currently is experiencing calf claudication in the right side after walking 7 minutes on a treadmill. She denies rest pain.  CHRONIC MEDICAL PROBLEMS:  Remain hypertension, elevated cholesterol and peripheral arterial disease.  These are all currently controlled and followed by Dr. Jacinto Halim.  PAST SURGICAL HISTORY:  As listed above.  She is currently not smoking.  PHYSICAL EXAM:  Vital signs:  Blood pressure is 174/95 in the left arm, heart rate 82 and regular, respirations 20.  Lower extremities:  She has 2+ femoral pulses bilaterally.  She has absent popliteal and pedal pulses in the right foot.  She has a 2 mm ulcer on the dorsum of her right second toe which is healing somewhat.  I reviewed her ABIs from 03/01/2011 which were 0.58 on the right with monophasic waveforms and 0.89 on the left with triphasic to monophasic waveforms.  I believe at this point we need to repeat the arteriogram on the patient and consider angioplasty and stenting of her right superficial femoral artery or consideration for a right leg bypass procedure if necessary. I believe she needs this due to her disabling claudication symptoms and continued breakdown of the ulceration on her right foot.  We have scheduled for her arteriogram on 06/03/2011.  We  will stop her Pradaxa on 05/27/2011.  Risks, benefits, possible complications and procedure details were explained to the patient today including but not limited to bleeding, infection, vessel injury, contrast reaction and she understands and agrees to proceed.    Janetta Hora. Fields, MD Electronically Signed  CEF/MEDQ  D:  05/13/2011  T:  05/13/2011  Job:  [id number]  cc:   Pamella Pert, MD

## 2011-05-17 ENCOUNTER — Ambulatory Visit
Admission: RE | Admit: 2011-05-17 | Discharge: 2011-05-17 | Disposition: A | Discharge status: Home / Self Care | Payer: BC Managed Care – PPO | Source: Ambulatory Visit | Attending: Otolaryngology | Admitting: Otolaryngology

## 2011-05-17 MED ORDER — GADOBENATE DIMEGLUMINE 529 MG/ML IV SOLN
14.0000 mL | Freq: Once | INTRAVENOUS | Status: AC | PRN
  Administered 2011-05-17: 14 mL via INTRAVENOUS

## 2011-06-03 ENCOUNTER — Ambulatory Visit (HOSPITAL_COMMUNITY)
Admission: RE | Admit: 2011-06-03 | Discharge: 2011-06-03 | Disposition: A | Discharge status: Home / Self Care | Payer: BC Managed Care – PPO | Source: Ambulatory Visit | Attending: Vascular Surgery | Admitting: Vascular Surgery

## 2011-06-03 DIAGNOSIS — I70209 Unspecified atherosclerosis of native arteries of extremities, unspecified extremity: Secondary | ICD-10-CM | POA: Insufficient documentation

## 2011-06-03 DIAGNOSIS — I70219 Atherosclerosis of native arteries of extremities with intermittent claudication, unspecified extremity: Secondary | ICD-10-CM

## 2011-06-03 DIAGNOSIS — Z01810 Encounter for preprocedural cardiovascular examination: Secondary | ICD-10-CM | POA: Insufficient documentation

## 2011-06-03 LAB — POCT I-STAT, CHEM 8
Creatinine, Ser: 1.5 mg/dL — ABNORMAL HIGH (ref 0.50–1.10)
Hemoglobin: 11.9 g/dL — ABNORMAL LOW (ref 12.0–15.0)
Sodium: 136 mEq/L (ref 135–145)
TCO2: 24 mmol/L (ref 0–100)

## 2011-06-20 ENCOUNTER — Other Ambulatory Visit (HOSPITAL_COMMUNITY): Payer: Self-pay | Admitting: Obstetrics and Gynecology

## 2011-06-20 DIAGNOSIS — R1900 Intra-abdominal and pelvic swelling, mass and lump, unspecified site: Secondary | ICD-10-CM

## 2011-06-20 NOTE — Op Note (Signed)
  [name], [name]              ACCOUNT NO.:  [id number]  MEDICAL RECORD NO.:  [id number]  LOCATION:  [facility]                         FACILITY:  [facility]  PHYSICIAN:  Janetta Hora. Fields, MD  DATE OF BIRTH:  November 14, 1951  DATE OF PROCEDURE:  06/03/2011 DATE OF DISCHARGE:  06/03/2011                              OPERATIVE REPORT   PROCEDURE:  Aortogram with right lower extremity runoff.  PREOPERATIVE DIAGNOSIS:  Claudication.  POSTOPERATIVE DIAGNOSIS:  Claudication.  ANESTHESIA:  Local with IV sedation.  OPERATIVE DETAILS:  After obtaining informed consent, the patient was taken to the peripheral vascular lab.  The patient was placed in supine position on the angio table.  Both groins were prepped and draped in usual sterile fashion.  Local anesthesia was infiltrated over the left common femoral artery.  An introducer needle was used to cannulate the left common femoral artery and a 0.035 Versacore wire threaded in the abdominal aorta under fluoroscopic guidance.  Next, a 5-French sheath was placed over the guidewire in the left common femoral artery and thoroughly flushed with heparinized saline.  A 5-French pigtail catheter was then placed over the guidewire in the abdominal aorta, and abdominal aortogram obtained.  This shows bilateral single renal arteries, which are widely patent.  The infrarenal abdominal aorta is patent.  There was some calcification of the iliac vessels, but these are patent with no evidence of stenosis in the common iliac, internal iliac, and external iliac artery.  Next, the 5-French pigtail catheter was removed over a guidewire, and a 5-French crossover catheter brought up on the operative field and used to selectively catheterize the right common iliac artery. A right lower extremity arteriogram was obtained.  This shows a small right external iliac artery.  There is a 40% narrowing of the proximal common femoral artery.  The area of previous femoral  endarterectomy was widely patent.  The right superficial femoral artery is occluded at its origin.  The profunda femoris artery is widely patent.  The above-knee popliteal artery reconstitutes above the knee via profunda collaterals. There is three-vessel runoff to the right foot, however, runoff is primarily by the peroneal and posterior tibial arteries.  There is some skip lesions of subtotal occlusion of the mid right anterior tibial artery.  Next, the 5-French straight catheter, which had been advanced all the way down to the distal right external iliac artery was removed over a guidewire.  The 5-French sheath was left in to be pulled in the holding area.  The patient tolerated the procedure well.  There were no complications.  Plan will be to perform a right femoral to above-knee popliteal bypass in approximately 1 month as is the scar tissue from the right femoral endarterectomy has completely resolved.     Janetta Hora. Fields, MD     CEF/MEDQ  D:  06/03/2011  T:  06/04/2011  Job:  [id number]  Electronically Signed by Fabienne Bruns MD on 06/20/2011 09:52:10 AM

## 2011-06-23 ENCOUNTER — Encounter: Payer: Self-pay | Admitting: Vascular Surgery

## 2011-06-23 ENCOUNTER — Ambulatory Visit (HOSPITAL_COMMUNITY): Admission: RE | Admit: 2011-06-23 | Payer: BC Managed Care – PPO | Source: Ambulatory Visit

## 2011-06-24 ENCOUNTER — Ambulatory Visit (HOSPITAL_COMMUNITY)
Admission: RE | Admit: 2011-06-24 | Discharge: 2011-06-24 | Disposition: A | Discharge status: Home / Self Care | Payer: BC Managed Care – PPO | Source: Ambulatory Visit | Attending: Obstetrics and Gynecology | Admitting: Obstetrics and Gynecology

## 2011-06-24 ENCOUNTER — Encounter (HOSPITAL_COMMUNITY): Payer: Self-pay | Admitting: Anesthesiology

## 2011-06-24 DIAGNOSIS — R1900 Intra-abdominal and pelvic swelling, mass and lump, unspecified site: Secondary | ICD-10-CM

## 2011-06-24 DIAGNOSIS — N9489 Other specified conditions associated with female genital organs and menstrual cycle: Secondary | ICD-10-CM | POA: Insufficient documentation

## 2011-06-24 MED ORDER — IOHEXOL 300 MG/ML  SOLN
80.0000 mL | Freq: Once | INTRAMUSCULAR | Status: AC | PRN
  Administered 2011-06-24: 80 mL via INTRAVENOUS

## 2011-07-14 ENCOUNTER — Ambulatory Visit: Payer: BC Managed Care – PPO | Admitting: Vascular Surgery

## 2013-01-09 IMAGING — CR DG FOOT COMPLETE 3+V*R*
3 series · 3 of 3 positions shown · non-contrast
Comparison: None.

CLINICAL DATA: Foot pain.  Osteomyelitis.Distal fifth metatarsal
ulceration.

RIGHT FOOT COMPLETE - 3+ VIEW

[t foot ap right]
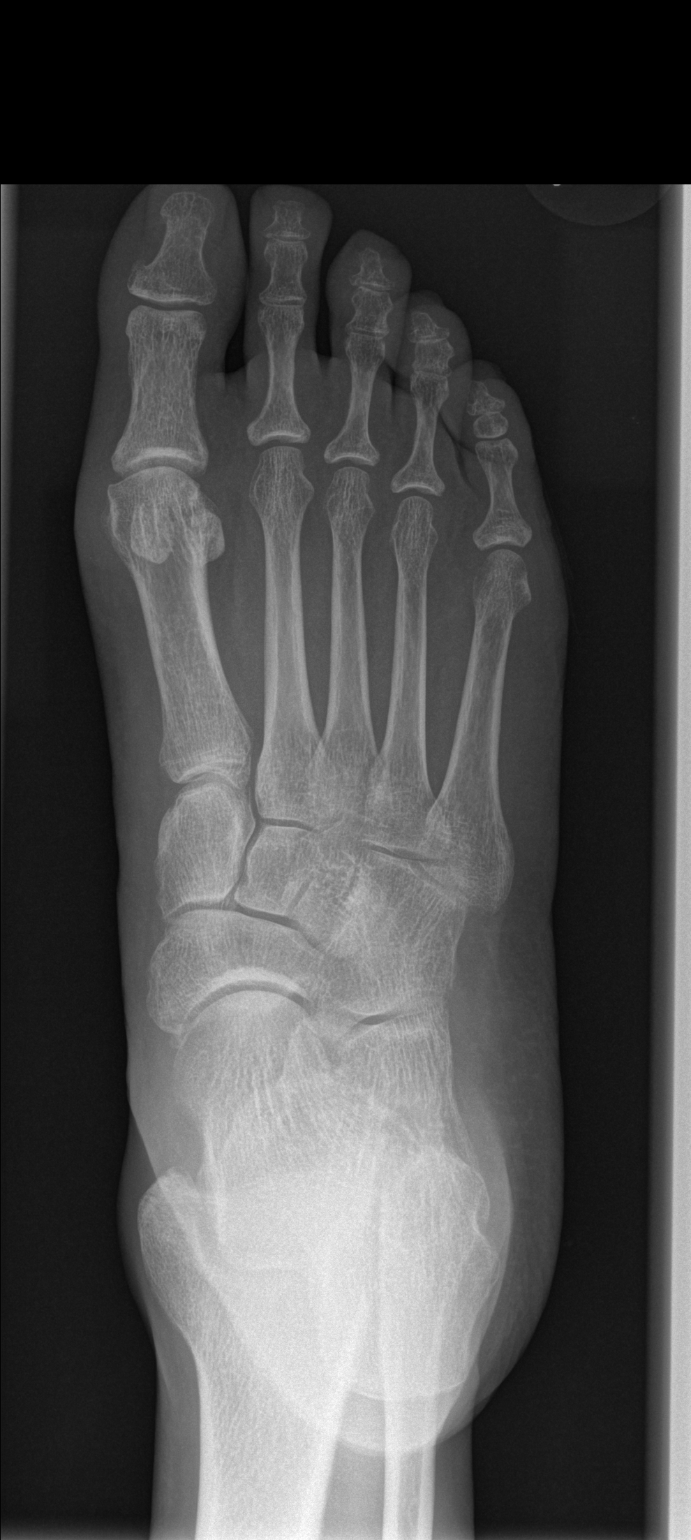

[t foot oblique right]
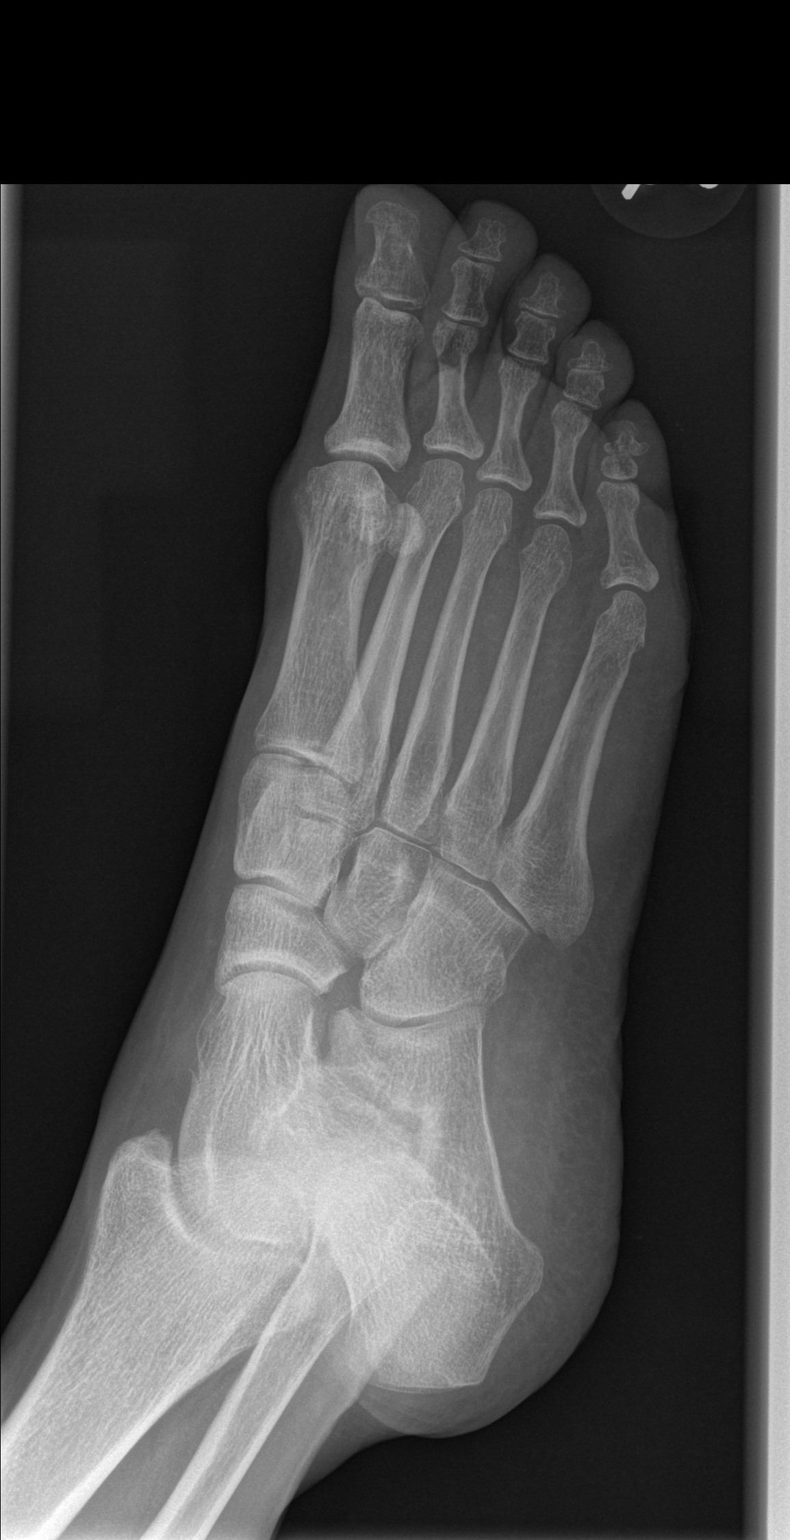

[t foot lat right]
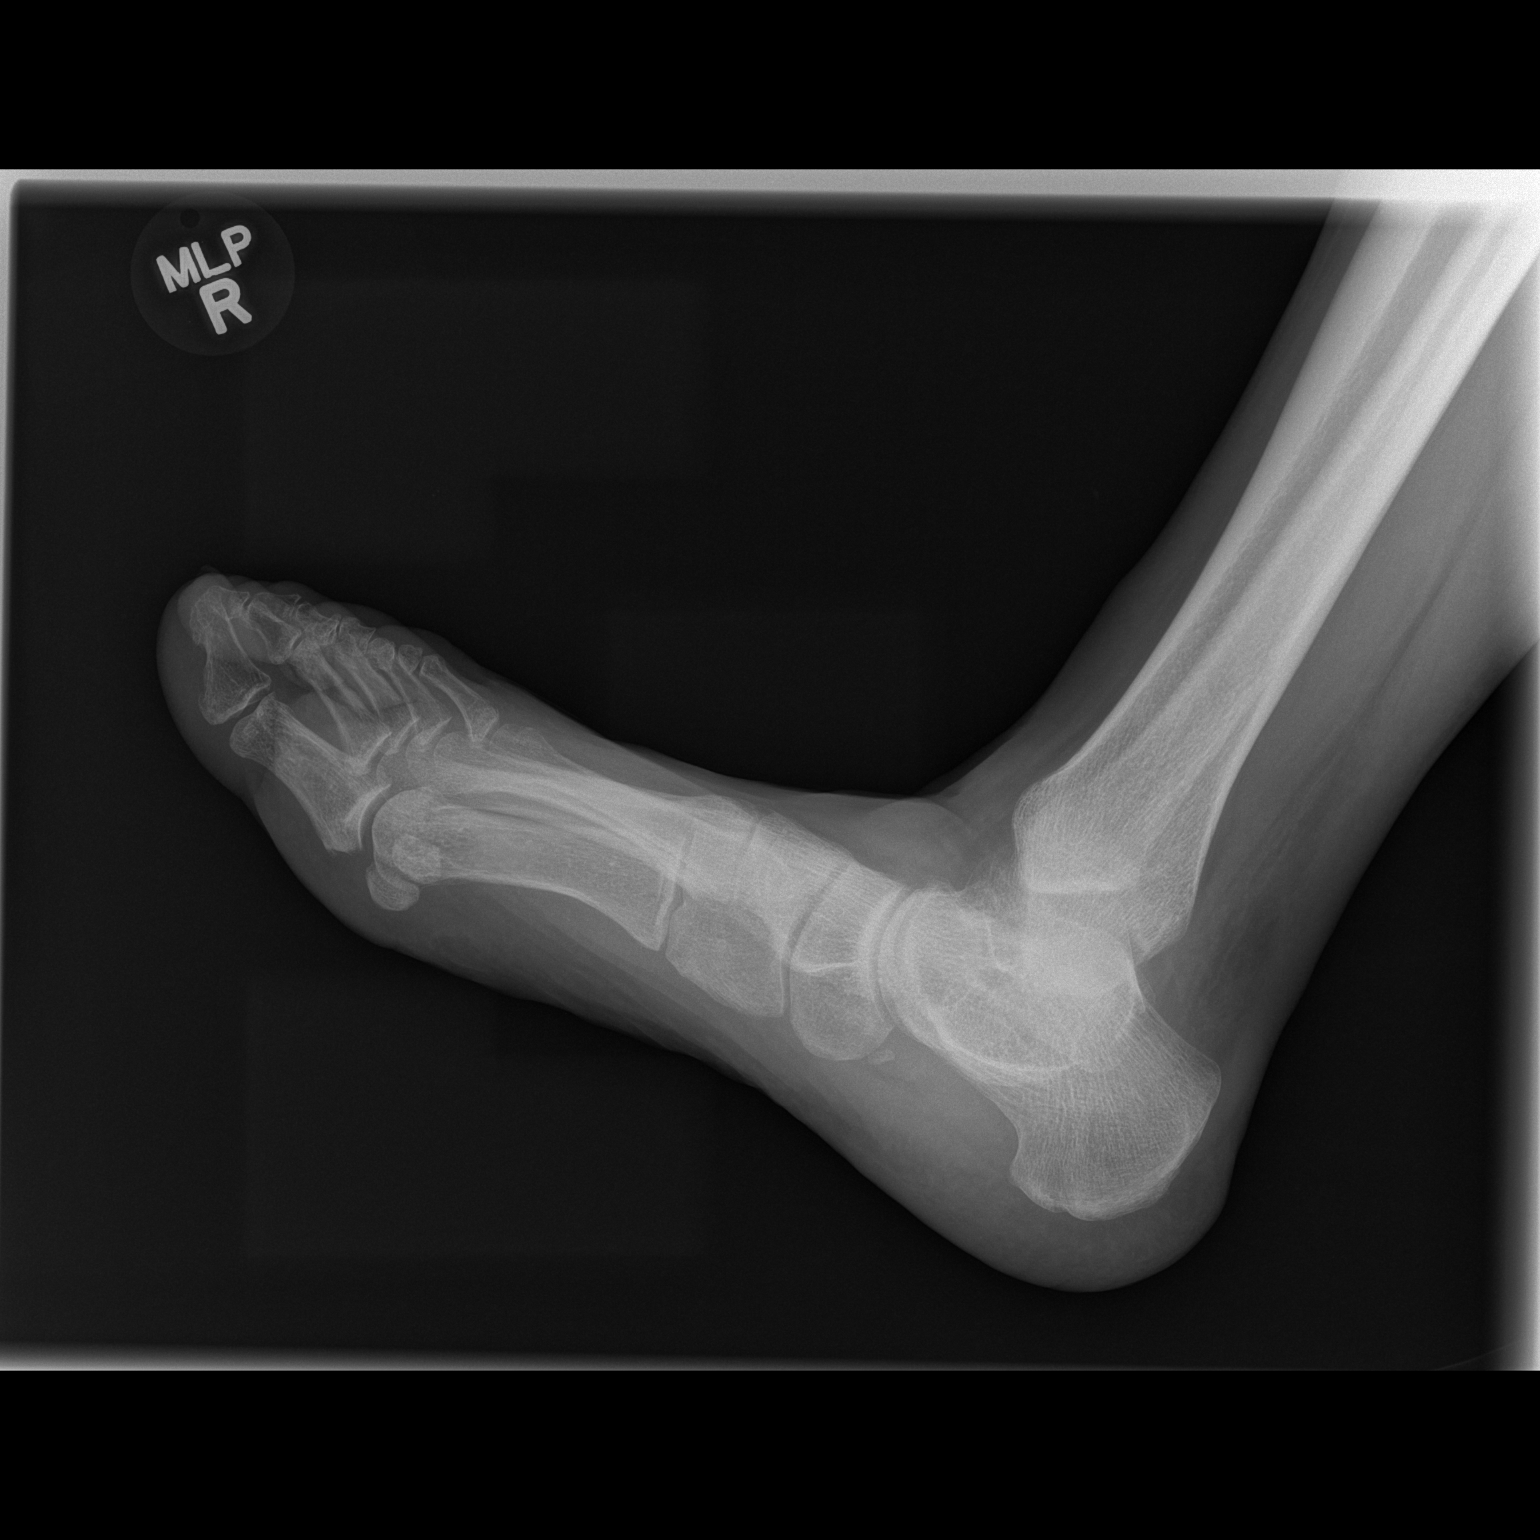

[3 of 3 positions shown; findings below may reference images not displayed]

FINDINGS: There is a small ulceration lateral to the right fifth
metatarsal neck.  There is no plain film evidence of osteomyelitis.
No fracture.  Mild first MTP joint osteoarthritis.  Alignment of
the bones of the foot is anatomic. The lateral view is oblique.
IMPRESSION: No acute osseous abnormality or evidence of osteomyelitis. Soft
tissue ulceration lateral to the fifth metatarsal neck.

## 2018-05-27 ENCOUNTER — Observation Stay (HOSPITAL_COMMUNITY): Payer: Medicare Other

## 2018-05-27 ENCOUNTER — Encounter (HOSPITAL_COMMUNITY): Payer: Self-pay | Admitting: Emergency Medicine

## 2018-05-27 ENCOUNTER — Emergency Department (HOSPITAL_COMMUNITY): Payer: Medicare Other

## 2018-05-27 ENCOUNTER — Inpatient Hospital Stay (HOSPITAL_COMMUNITY)
Admission: EM | Admit: 2018-05-27 | Discharge: 2018-05-31 | DRG: 897 | Disposition: A | Discharge status: Home / Self Care | Payer: Medicare Other | Attending: Internal Medicine | Admitting: Internal Medicine

## 2018-05-27 DIAGNOSIS — Z8279 Family history of other congenital malformations, deformations and chromosomal abnormalities: Secondary | ICD-10-CM

## 2018-05-27 DIAGNOSIS — N179 Acute kidney failure, unspecified: Secondary | ICD-10-CM

## 2018-05-27 DIAGNOSIS — F10239 Alcohol dependence with withdrawal, unspecified: Principal | ICD-10-CM | POA: Diagnosis present

## 2018-05-27 DIAGNOSIS — E785 Hyperlipidemia, unspecified: Secondary | ICD-10-CM | POA: Diagnosis present

## 2018-05-27 DIAGNOSIS — N183 Chronic kidney disease, stage 3 (moderate): Secondary | ICD-10-CM | POA: Diagnosis present

## 2018-05-27 DIAGNOSIS — Z833 Family history of diabetes mellitus: Secondary | ICD-10-CM

## 2018-05-27 DIAGNOSIS — Z87891 Personal history of nicotine dependence: Secondary | ICD-10-CM

## 2018-05-27 DIAGNOSIS — Z8249 Family history of ischemic heart disease and other diseases of the circulatory system: Secondary | ICD-10-CM

## 2018-05-27 DIAGNOSIS — E7800 Pure hypercholesterolemia, unspecified: Secondary | ICD-10-CM | POA: Diagnosis present

## 2018-05-27 DIAGNOSIS — Z803 Family history of malignant neoplasm of breast: Secondary | ICD-10-CM

## 2018-05-27 DIAGNOSIS — Z7982 Long term (current) use of aspirin: Secondary | ICD-10-CM

## 2018-05-27 DIAGNOSIS — R443 Hallucinations, unspecified: Secondary | ICD-10-CM | POA: Diagnosis present

## 2018-05-27 DIAGNOSIS — J449 Chronic obstructive pulmonary disease, unspecified: Secondary | ICD-10-CM | POA: Diagnosis present

## 2018-05-27 DIAGNOSIS — E538 Deficiency of other specified B group vitamins: Secondary | ICD-10-CM | POA: Diagnosis present

## 2018-05-27 DIAGNOSIS — R4182 Altered mental status, unspecified: Secondary | ICD-10-CM | POA: Diagnosis present

## 2018-05-27 DIAGNOSIS — K7010 Alcoholic hepatitis without ascites: Secondary | ICD-10-CM | POA: Diagnosis present

## 2018-05-27 DIAGNOSIS — I129 Hypertensive chronic kidney disease with stage 1 through stage 4 chronic kidney disease, or unspecified chronic kidney disease: Secondary | ICD-10-CM | POA: Diagnosis present

## 2018-05-27 DIAGNOSIS — I739 Peripheral vascular disease, unspecified: Secondary | ICD-10-CM | POA: Diagnosis present

## 2018-05-27 DIAGNOSIS — R945 Abnormal results of liver function studies: Secondary | ICD-10-CM

## 2018-05-27 DIAGNOSIS — Z23 Encounter for immunization: Secondary | ICD-10-CM

## 2018-05-27 DIAGNOSIS — H9190 Unspecified hearing loss, unspecified ear: Secondary | ICD-10-CM | POA: Diagnosis present

## 2018-05-27 DIAGNOSIS — E876 Hypokalemia: Secondary | ICD-10-CM | POA: Diagnosis present

## 2018-05-27 LAB — URINALYSIS, ROUTINE W REFLEX MICROSCOPIC
BILIRUBIN URINE: NEGATIVE
Bacteria, UA: NONE SEEN
GLUCOSE, UA: NEGATIVE mg/dL
Ketones, ur: NEGATIVE mg/dL
NITRITE: NEGATIVE
Protein, ur: 100 mg/dL — AB
Renal Epithelial: 1
SPECIFIC GRAVITY, URINE: 1.013 (ref 1.005–1.030)
pH: 5 (ref 5.0–8.0)

## 2018-05-27 LAB — CBC
HEMATOCRIT: 44.7 % (ref 36.0–46.0)
Hemoglobin: 14.5 g/dL (ref 12.0–15.0)
MCH: 27.7 pg (ref 26.0–34.0)
MCHC: 32.4 g/dL (ref 30.0–36.0)
MCV: 85.3 fL (ref 78.0–100.0)
Platelets: 334 10*3/uL (ref 150–400)
RBC: 5.24 MIL/uL — AB (ref 3.87–5.11)
RDW: 16.8 % — ABNORMAL HIGH (ref 11.5–15.5)
WBC: 17.7 10*3/uL — AB (ref 4.0–10.5)

## 2018-05-27 LAB — COMPREHENSIVE METABOLIC PANEL
ALBUMIN: 4.2 g/dL (ref 3.5–5.0)
ALT: 30 U/L (ref 14–54)
AST: 69 U/L — AB (ref 15–41)
Alkaline Phosphatase: 72 U/L (ref 38–126)
Anion gap: 17 — ABNORMAL HIGH (ref 5–15)
BUN: 25 mg/dL — AB (ref 6–20)
CHLORIDE: 104 mmol/L (ref 101–111)
CO2: 21 mmol/L — AB (ref 22–32)
Calcium: 10.3 mg/dL (ref 8.9–10.3)
Creatinine, Ser: 2.6 mg/dL — ABNORMAL HIGH (ref 0.44–1.00)
GFR calc Af Amer: 21 mL/min — ABNORMAL LOW (ref >60)
GFR, EST NON AFRICAN AMERICAN: 18 mL/min — AB (ref >60)
GLUCOSE: 188 mg/dL — AB (ref 65–99)
POTASSIUM: 3.7 mmol/L (ref 3.5–5.1)
SODIUM: 142 mmol/L (ref 135–145)
Total Bilirubin: 1.7 mg/dL — ABNORMAL HIGH (ref 0.3–1.2)
Total Protein: 8.6 g/dL — ABNORMAL HIGH (ref 6.5–8.1)

## 2018-05-27 LAB — RAPID URINE DRUG SCREEN, HOSP PERFORMED
Amphetamines: NOT DETECTED
Benzodiazepines: NOT DETECTED
COCAINE: NOT DETECTED
Opiates: NOT DETECTED
TETRAHYDROCANNABINOL: NOT DETECTED

## 2018-05-27 LAB — CBG MONITORING, ED: Glucose-Capillary: 171 mg/dL — ABNORMAL HIGH (ref 65–99)

## 2018-05-27 LAB — SALICYLATE LEVEL

## 2018-05-27 LAB — ACETAMINOPHEN LEVEL: ACETAMINOPHEN (TYLENOL), SERUM: 15 ug/mL (ref 10–30)

## 2018-05-27 LAB — TSH: TSH: 0.508 u[IU]/mL (ref 0.350–4.500)

## 2018-05-27 LAB — ETHANOL: Alcohol, Ethyl (B): <10 mg/dL (ref <10)

## 2018-05-27 LAB — AMMONIA: Ammonia: <9 umol/L — ABNORMAL LOW (ref 9–35)

## 2018-05-27 MED ORDER — SODIUM CHLORIDE 0.9 % IV SOLN
1.0000 g | INTRAVENOUS | Status: DC
  Administered 2018-05-27 – 2018-05-28 (×2): 1 g via INTRAVENOUS
  Filled 2018-05-27 (×2): qty 10

## 2018-05-27 MED ORDER — SODIUM CHLORIDE 0.9 % IV BOLUS
1000.0000 mL | Freq: Once | INTRAVENOUS | Status: AC
  Administered 2018-05-27: 1000 mL via INTRAVENOUS

## 2018-05-27 MED ORDER — VITAMIN B-1 100 MG PO TABS
100.0000 mg | ORAL_TABLET | Freq: Once | ORAL | Status: AC
  Administered 2018-05-27: 100 mg via ORAL
  Filled 2018-05-27: qty 1

## 2018-05-27 NOTE — ED Provider Notes (Signed)
[facility] EMERGENCY DEPARTMENT Provider Note   CSN: [id number] Arrival date & time: 05/27/18  1639     History   Chief Complaint Chief Complaint  Patient presents with  . Altered Mental Status    HPI [name] is a 67 y.o. female presenting for evaluation of altered mental status.  Of a 5 caveat, patient is altered.  Per EMS, had barricaded herself in the bathroom of a hotel room after she had checked out, refusing to leave.  There were blue pills noted in her room.  Patient was acting abnormally, and thus was brought to the ER.  On discussion with the patient, patient is able to state who she is and where she is, but not why she is in the ER.  She is unable to tell me what she did this morning.  She reports that she drinks "lots" of alcohol, unable to quantify.  States she last drank last night.  She denies drug use, but then states the pills in her room were sleeping pills and pain pills, which she uses to get high.  She denies pain.  She denies fevers, shortness of breath, or any symptoms at this time.  Patient denies medical history or being on any medications.    Per chart review, patient with a history of vascular disease and hypertension.  Last chart was from 7 years ago.  HPI  Past Medical History:  Diagnosis Date  . Hypercholesteremia   . Hypertension   . Vascular disease peripheral    There are no active problems to display for this patient.   Past Surgical History:  Procedure Laterality Date  . ANGIOPLASTY  patch angioplasty of right femoral artery.  Marland Kitchen aortogram  with right lower extremity runoff  . ENDARTERECTOMY  endarterectomy of right external iliac and common femoral artery.  . FEMORAL EMBOLOECTOMY  right     OB History   None      Home Medications    Prior to Admission medications   Medication Sig Start Date End Date Taking? Authorizing Provider  aspirin 81 MG tablet Take 81 mg by mouth daily.      [provider]  Cholecalciferol (VITAMIN D3) 5000 UNITS CAPS Take by mouth daily.      [provider]  cilostazol (PLETAL) 100 MG tablet Take 100 mg by mouth 2 (two) times daily.      [provider]  dabigatran (PRADAXA) 150 MG CAPS Take 150 mg by mouth every 12 (twelve) hours.      [provider]  Multiple Vitamin (MULTIVITAMIN) capsule Take 1 capsule by mouth daily.      [provider]  Olmesartan-Amlodipine-HCTZ (TRIBENZOR) 40-10-12.5 MG TABS Take by mouth daily.      [provider]  Pitavastatin Calcium (LIVALO) 2 MG TABS Take 2 mg by mouth daily.      [provider]    Family History Family History  Problem Relation Age of Onset  . Heart disease Mother     Social History Social History   Tobacco Use  . Smoking status: Former Smoker    Packs/day: 0.50    Years: 20.00    Pack years: 10.00    Last attempt to quit: 12/27/2010    Years since quitting: 7.4  Substance Use Topics  . Alcohol use: Yes    Comment: She does not consume alcohol regularly.  . Drug use: Not on file     Allergies   Patient has  no known allergies.   Review of Systems Review of Systems  Unable to perform ROS: Mental status change  Constitutional: Negative for fever.  Respiratory: Negative for cough and shortness of breath.   Cardiovascular: Negative for chest pain.     Physical Exam Updated Vital Signs BP (!) 177/96   Pulse 89   Temp 98.2 F (36.8 C) (Oral)   Resp 20   Ht 5\' 6"  (1.676 m)   Wt 68 kg (150 lb)   SpO2 99%   BMI 24.21 kg/m   Physical Exam  Constitutional: She appears well-developed and well-nourished. No distress.  Patient appears dehydrated, lips are cracked and dry. In no acute distress  HENT:  Head: Normocephalic and atraumatic.  Mouth/Throat: Uvula is midline and oropharynx is clear and moist. Mucous membranes are dry.  Eyes: Pupils are equal, round, and reactive to light. Conjunctivae and EOM are normal.    EOMI and PERRLA.  Neck: Normal range of motion. Neck supple.  Cardiovascular: Normal rate, regular rhythm and intact distal pulses.  Pulmonary/Chest: Effort normal and breath sounds normal. No respiratory distress. She has no wheezes.  Clear lung sounds in all fields.  Speaking in full sentences.  Abdominal: Soft. She exhibits no distension and no mass. There is no tenderness. There is no guarding.  Musculoskeletal: Normal range of motion. She exhibits no edema or tenderness.  No leg pain or swelling.  Radial and pedal pulses intact bilaterally.  Neurological: She is alert. She has normal strength. No cranial nerve deficit or sensory deficit. GCS eye subscore is 4. GCS verbal subscore is 4. GCS motor subscore is 6.  Alert to person, place, and time.  Unable to state why she is in the ER.  Occasionally, responses to questions are not appropriate/unrelated to the question.  Otherwise, no obvious neurologic deficits.  Skin: Skin is warm and dry.  Psychiatric:  Patient affect odd.   Nursing note and vitals reviewed.    ED Treatments / Results  Labs (all labs ordered are listed, but only abnormal results are displayed) Labs Reviewed  COMPREHENSIVE METABOLIC PANEL - Abnormal; Notable for the following components:      Result Value   CO2 21 (*)    Glucose, Bld 188 (*)    BUN 25 (*)    Creatinine, Ser 2.60 (*)    Total Protein 8.6 (*)    AST 69 (*)    Total Bilirubin 1.7 (*)    GFR calc non Af Amer 18 (*)    GFR calc Af Amer 21 (*)    Anion gap 17 (*)    All other components within normal limits  CBC - Abnormal; Notable for the following components:   WBC 17.7 (*)    RBC 5.24 (*)    RDW 16.8 (*)    All other components within normal limits  AMMONIA - Abnormal; Notable for the following components:   Ammonia <9 (*)    All other components within normal limits  URINALYSIS, ROUTINE W REFLEX MICROSCOPIC - Abnormal; Notable for the following components:   APPearance HAZY (*)    Hgb  urine dipstick MODERATE (*)    Protein, ur 100 (*)    Leukocytes, UA SMALL (*)    All other components within normal limits  CBG MONITORING, ED - Abnormal; Notable for the following components:   Glucose-Capillary 171 (*)    All other components within normal limits  ETHANOL  ACETAMINOPHEN LEVEL  SALICYLATE LEVEL  RAPID URINE DRUG SCREEN, HOSP  PERFORMED    EKG None  Radiology Dg Chest 2 View  Result Date: 05/27/2018 CLINICAL DATA:  Altered mental status. EXAM: CHEST - 2 VIEW COMPARISON:  02/01/2011. FINDINGS: Poor inspiration. Interval ill-defined opacity at the left lung base anteriorly. Mildly prominent interstitial markings, accentuated by the poor inspiration. Diffuse osteopenia. IMPRESSION: 1. Poor inspiration with otherwise stable changes of COPD. 2. Increased density at the left lung base, most likely due to accentuation of an epicardial fat pad due to the poor inspiration. Electronically Signed   By: Beckie Salts M.D.   On: 05/27/2018 20:59   Ct Head Wo Contrast  Result Date: 05/27/2018 CLINICAL DATA:  Altered mental status. EXAM: CT HEAD WITHOUT CONTRAST TECHNIQUE: Contiguous axial images were obtained from the base of the skull through the vertex without intravenous contrast. COMPARISON:  MR brain dated May 17, 2011. FINDINGS: Brain: No evidence of acute infarction, hemorrhage, hydrocephalus, extra-axial collection or mass lesion/mass effect. 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 Stable mild atrophy. Vascular: Atherosclerotic vascular calcification of the carotid siphons. No hyperdense vessel. Skull: Negative for fracture or focal lesion. Sinuses/Orbits: No acute finding. Other: None. IMPRESSION: 1.  No acute intracranial abnormality. Electronically Signed   By: Obie Dredge M.D.   On: 05/27/2018 19:06    Procedures Procedures (including critical care time)  Medications Ordered in ED Medications  sodium chloride 0.9 % bolus 1,000 mL (0 mLs Intravenous Stopped  05/27/18 2011)  thiamine (VITAMIN B-1) tablet 100 mg (100 mg Oral Given 05/27/18 2019)     Initial Impression / Assessment and Plan / ED Course  I have reviewed the triage vital signs and the nursing notes.  Pertinent labs & imaging results that were available during my care of the patient were reviewed by me and considered in my medical decision making (see chart for details).     Patient presenting for altered mental status.  Physical exam shows odd affect and patient with abnormal/inappropriate responses to questions.  She is alert to person place and time.  As we do not have any medical charts the past 7 years, practically an unknown history.  Will obtain labs, urine, CT head, chest x-ray and reevaluate. Case dicussed with attending, Dr. Deretha Emory evaluated the pt.   Labs show elevated white count 17.7.  AKI at 2.6.  Mild acidosis.  Urine without infection.  Chest x-ray viewed and interpreted by me, no obvious infection, consistent with COPD-like changes.  CT head without acute findings, shows old infarcts which have been seen on previous scans.  Patient remains altered despite hydration.  When I discussed possibility of admission with patient, she responds " I don't normally check the scales."  UDS negative. ?uremia. ?drug related AMS for mediations not tested. ?psych. Will call for admission.   Discussed with Dr. Selena Batten from [facility], who will admit the pt.   Final Clinical Impressions(s) / ED Diagnoses   Final diagnoses:  Altered mental status, unspecified altered mental status type  AKI (acute kidney injury) [facility])    ED Discharge Orders    None       Alveria Apley, PA-C 05/27/18 2320    Vanetta Mulders, MD 05/30/18 0730

## 2018-05-27 NOTE — ED Triage Notes (Signed)
Per EMS this pt was at a local hotel for an unknown reason and checked out today at 12:00 and went back to her room where staff found her later and tried to open the door she barred the door and then locked herself in the bathroom.  There were some unknown blue pills on her table.   Currently denies SI/HI and is AOx4.  Pt is not comprehending comands well, but is able to follow them after instruction.  NAD noted at this time.

## 2018-05-27 NOTE — ED Notes (Signed)
Pt transported to Xray. 

## 2018-05-27 NOTE — ED Notes (Signed)
Pt to xray via stretcher

## 2018-05-27 NOTE — ED Notes (Signed)
Pt able to correctly state full name, city and state as well as year but then answered other questions with nonsensical answers.  Pt is alert, NAD

## 2018-05-27 NOTE — ED Provider Notes (Signed)
Medical screening examination/treatment/procedure(s) were conducted as a shared visit with non-physician practitioner(s) and myself.  I personally evaluated the patient during the encounter.  None   Patient seen by me along with physician assistant.  Results for orders placed or performed during the hospital encounter of 05/27/18  Comprehensive metabolic panel  Result Value Ref Range   Sodium 142 135 - 145 mmol/L   Potassium 3.7 3.5 - 5.1 mmol/L   Chloride 104 101 - 111 mmol/L   CO2 21 (L) 22 - 32 mmol/L   Glucose, Bld 188 (H) 65 - 99 mg/dL   BUN 25 (H) 6 - 20 mg/dL   Creatinine, Ser 4.092.60 (H) 0.44 - 1.00 mg/dL   Calcium 81.110.3 8.9 - 91.410.3 mg/dL   Total Protein 8.6 (H) 6.5 - 8.1 g/dL   Albumin 4.2 3.5 - 5.0 g/dL   AST 69 (H) 15 - 41 U/L   ALT 30 14 - 54 U/L   Alkaline Phosphatase 72 38 - 126 U/L   Total Bilirubin 1.7 (H) 0.3 - 1.2 mg/dL   GFR calc non Af Amer 18 (L) >60 mL/min   GFR calc Af Amer 21 (L) >60 mL/min   Anion gap 17 (H) 5 - 15  CBC  Result Value Ref Range   WBC 17.7 (H) 4.0 - 10.5 K/uL   RBC 5.24 (H) 3.87 - 5.11 MIL/uL   Hemoglobin 14.5 12.0 - 15.0 g/dL   HCT 78.244.7 95.636.0 - 21.346.0 %   MCV 85.3 78.0 - 100.0 fL   MCH 27.7 26.0 - 34.0 pg   MCHC 32.4 30.0 - 36.0 g/dL   RDW 08.616.8 (H) 57.811.5 - 46.915.5 %   Platelets 334 150 - 400 K/uL  Ammonia  Result Value Ref Range   Ammonia <9 (L) 9 - 35 umol/L  Urinalysis, Routine w reflex microscopic  Result Value Ref Range   Color, Urine YELLOW YELLOW   APPearance HAZY (A) CLEAR   Specific Gravity, Urine 1.013 1.005 - 1.030   pH 5.0 5.0 - 8.0   Glucose, UA NEGATIVE NEGATIVE mg/dL   Hgb urine dipstick MODERATE (A) NEGATIVE   Bilirubin Urine NEGATIVE NEGATIVE   Ketones, ur NEGATIVE NEGATIVE mg/dL   Protein, ur 629100 (A) NEGATIVE mg/dL   Nitrite NEGATIVE NEGATIVE   Leukocytes, UA SMALL (A) NEGATIVE   RBC / HPF 11-20 0 - 5 RBC/hpf   WBC, UA 21-50 0 - 5 WBC/hpf   Bacteria, UA NONE SEEN NONE SEEN   Squamous Epithelial / LPF 0-5 0 - 5   Renal Epithelial 1    Mucus PRESENT    Hyaline Casts, UA PRESENT   Rapid urine drug screen (hospital performed)  Result Value Ref Range   Opiates NONE DETECTED NONE DETECTED   Cocaine NONE DETECTED NONE DETECTED   Benzodiazepines NONE DETECTED NONE DETECTED   Amphetamines NONE DETECTED NONE DETECTED   Tetrahydrocannabinol NONE DETECTED NONE DETECTED   Barbiturates (A) NONE DETECTED    Result not available. Reagent lot number recalled by manufacturer.  Ethanol  Result Value Ref Range   Alcohol, Ethyl (B) <10 <10 mg/dL  Acetaminophen level  Result Value Ref Range   Acetaminophen (Tylenol), Serum 15 10 - 30 ug/mL  Salicylate level  Result Value Ref Range   Salicylate Lvl <7.0 2.8 - 30.0 mg/dL  CBG monitoring, ED  Result Value Ref Range   Glucose-Capillary 171 (H) 65 - 99 mg/dL   Dg Chest 2 View  Result Date: 05/27/2018 CLINICAL DATA:  Altered mental status.  EXAM: CHEST - 2 VIEW COMPARISON:  02/01/2011. FINDINGS: Poor inspiration. Interval ill-defined opacity at the left lung base anteriorly. Mildly prominent interstitial markings, accentuated by the poor inspiration. Diffuse osteopenia. IMPRESSION: 1. Poor inspiration with otherwise stable changes of COPD. 2. Increased density at the left lung base, most likely due to accentuation of an epicardial fat pad due to the poor inspiration. Electronically Signed   By: Beckie Salts M.D.   On: 05/27/2018 20:59   Ct Head Wo Contrast  Result Date: 05/27/2018 CLINICAL DATA:  Altered mental status. EXAM: CT HEAD WITHOUT CONTRAST TECHNIQUE: Contiguous axial images were obtained from the base of the skull through the vertex without intravenous contrast. COMPARISON:  MR brain dated May 17, 2011. FINDINGS: Brain: No evidence of acute infarction, hemorrhage, hydrocephalus, extra-axial collection or mass lesion/mass effect. 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 Stable mild atrophy. Vascular: Atherosclerotic vascular calcification of the carotid  siphons. No hyperdense vessel. Skull: Negative for fracture or focal lesion. Sinuses/Orbits: No acute finding. Other: None. IMPRESSION: 1.  No acute intracranial abnormality. Electronically Signed   By: Obie Dredge M.D.   On: 05/27/2018 19:06   Patient brought in by EMS.  Patient had locked herself into a hotel room after she had checked out.  Patient was comprehending commands.  Seem to have some confusion denied any suicidal homicidal ideation.  There was pills in the room.  As stated patient presenting for altered mental status.  Patient had an odd affect.  CT head without any acute findings chest x-ray no obvious infection.  Patient still altered despite hydration.  Patient will require admission for the altered mental status.  Urine drug screen was negative.     Vanetta Mulders, MD 05/27/18 2256

## 2018-05-27 NOTE — ED Notes (Signed)
Heavy 1 assist to bedside commode, pt is very unsteady attempting to stand on her own

## 2018-05-28 ENCOUNTER — Encounter (HOSPITAL_COMMUNITY): Payer: Self-pay

## 2018-05-28 ENCOUNTER — Inpatient Hospital Stay (HOSPITAL_COMMUNITY): Payer: Medicare Other

## 2018-05-28 ENCOUNTER — Other Ambulatory Visit: Payer: Self-pay

## 2018-05-28 DIAGNOSIS — Z87891 Personal history of nicotine dependence: Secondary | ICD-10-CM

## 2018-05-28 DIAGNOSIS — Z803 Family history of malignant neoplasm of breast: Secondary | ICD-10-CM | POA: Diagnosis not present

## 2018-05-28 DIAGNOSIS — E538 Deficiency of other specified B group vitamins: Secondary | ICD-10-CM | POA: Diagnosis not present

## 2018-05-28 DIAGNOSIS — Z23 Encounter for immunization: Secondary | ICD-10-CM | POA: Diagnosis not present

## 2018-05-28 DIAGNOSIS — N183 Chronic kidney disease, stage 3 (moderate): Secondary | ICD-10-CM | POA: Diagnosis not present

## 2018-05-28 DIAGNOSIS — R443 Hallucinations, unspecified: Secondary | ICD-10-CM | POA: Diagnosis not present

## 2018-05-28 DIAGNOSIS — E785 Hyperlipidemia, unspecified: Secondary | ICD-10-CM | POA: Diagnosis not present

## 2018-05-28 DIAGNOSIS — N179 Acute kidney failure, unspecified: Secondary | ICD-10-CM

## 2018-05-28 DIAGNOSIS — E7800 Pure hypercholesterolemia, unspecified: Secondary | ICD-10-CM | POA: Diagnosis not present

## 2018-05-28 DIAGNOSIS — Z8249 Family history of ischemic heart disease and other diseases of the circulatory system: Secondary | ICD-10-CM | POA: Diagnosis not present

## 2018-05-28 DIAGNOSIS — K7010 Alcoholic hepatitis without ascites: Secondary | ICD-10-CM | POA: Diagnosis present

## 2018-05-28 DIAGNOSIS — Z7982 Long term (current) use of aspirin: Secondary | ICD-10-CM | POA: Diagnosis not present

## 2018-05-28 DIAGNOSIS — Z833 Family history of diabetes mellitus: Secondary | ICD-10-CM | POA: Diagnosis not present

## 2018-05-28 DIAGNOSIS — E876 Hypokalemia: Secondary | ICD-10-CM | POA: Diagnosis not present

## 2018-05-28 DIAGNOSIS — J449 Chronic obstructive pulmonary disease, unspecified: Secondary | ICD-10-CM | POA: Diagnosis present

## 2018-05-28 DIAGNOSIS — Z8279 Family history of other congenital malformations, deformations and chromosomal abnormalities: Secondary | ICD-10-CM | POA: Diagnosis not present

## 2018-05-28 DIAGNOSIS — F10239 Alcohol dependence with withdrawal, unspecified: Secondary | ICD-10-CM | POA: Diagnosis not present

## 2018-05-28 DIAGNOSIS — I129 Hypertensive chronic kidney disease with stage 1 through stage 4 chronic kidney disease, or unspecified chronic kidney disease: Secondary | ICD-10-CM | POA: Diagnosis not present

## 2018-05-28 DIAGNOSIS — I739 Peripheral vascular disease, unspecified: Secondary | ICD-10-CM | POA: Diagnosis present

## 2018-05-28 DIAGNOSIS — R945 Abnormal results of liver function studies: Secondary | ICD-10-CM

## 2018-05-28 DIAGNOSIS — H9190 Unspecified hearing loss, unspecified ear: Secondary | ICD-10-CM | POA: Diagnosis not present

## 2018-05-28 LAB — COMPREHENSIVE METABOLIC PANEL
ALT: 37 U/L (ref 14–54)
AST: 83 U/L — ABNORMAL HIGH (ref 15–41)
Albumin: 3.5 g/dL (ref 3.5–5.0)
Alkaline Phosphatase: 58 U/L (ref 38–126)
Anion gap: 16 — ABNORMAL HIGH (ref 5–15)
BUN: 22 mg/dL — ABNORMAL HIGH (ref 6–20)
CHLORIDE: 110 mmol/L (ref 101–111)
CO2: 15 mmol/L — AB (ref 22–32)
Calcium: 6.9 mg/dL — ABNORMAL LOW (ref 8.9–10.3)
Creatinine, Ser: 2 mg/dL — ABNORMAL HIGH (ref 0.44–1.00)
GFR calc non Af Amer: 25 mL/min — ABNORMAL LOW (ref >60)
GFR, EST AFRICAN AMERICAN: 29 mL/min — AB (ref >60)
Glucose, Bld: 120 mg/dL — ABNORMAL HIGH (ref 65–99)
POTASSIUM: 4 mmol/L (ref 3.5–5.1)
SODIUM: 141 mmol/L (ref 135–145)
Total Bilirubin: 1.2 mg/dL (ref 0.3–1.2)
Total Protein: 7 g/dL (ref 6.5–8.1)

## 2018-05-28 LAB — CBC
HEMATOCRIT: 39.8 % (ref 36.0–46.0)
HEMOGLOBIN: 12.6 g/dL (ref 12.0–15.0)
MCH: 27.6 pg (ref 26.0–34.0)
MCHC: 31.7 g/dL (ref 30.0–36.0)
MCV: 87.3 fL (ref 78.0–100.0)
Platelets: 277 10*3/uL (ref 150–400)
RBC: 4.56 MIL/uL (ref 3.87–5.11)
RDW: 17.1 % — ABNORMAL HIGH (ref 11.5–15.5)
WBC: 13.8 10*3/uL — ABNORMAL HIGH (ref 4.0–10.5)

## 2018-05-28 LAB — SODIUM, URINE, RANDOM: Sodium, Ur: 123 mmol/L

## 2018-05-28 LAB — SEDIMENTATION RATE: SED RATE: 23 mm/h — AB (ref 0–22)

## 2018-05-28 LAB — VITAMIN B12: VITAMIN B 12: 130 pg/mL — AB (ref 180–914)

## 2018-05-28 LAB — HIV ANTIBODY (ROUTINE TESTING W REFLEX): HIV SCREEN 4TH GENERATION: NONREACTIVE

## 2018-05-28 LAB — MAGNESIUM: Magnesium: 1.4 mg/dL — ABNORMAL LOW (ref 1.7–2.4)

## 2018-05-28 MED ORDER — FOLIC ACID 1 MG PO TABS
1.0000 mg | ORAL_TABLET | Freq: Every day | ORAL | Status: DC
  Administered 2018-05-28 – 2018-05-31 (×4): 1 mg via ORAL
  Filled 2018-05-28 (×4): qty 1

## 2018-05-28 MED ORDER — VITAMIN B-12 1000 MCG PO TABS
1000.0000 ug | ORAL_TABLET | Freq: Every day | ORAL | Status: DC
  Administered 2018-05-28 – 2018-05-31 (×4): 1000 ug via ORAL
  Filled 2018-05-28 (×4): qty 1

## 2018-05-28 MED ORDER — THIAMINE HCL 100 MG/ML IJ SOLN
100.0000 mg | Freq: Every day | INTRAMUSCULAR | Status: DC
  Filled 2018-05-28: qty 2

## 2018-05-28 MED ORDER — ACETAMINOPHEN 325 MG PO TABS
650.0000 mg | ORAL_TABLET | Freq: Four times a day (QID) | ORAL | Status: DC | PRN
  Filled 2018-05-28: qty 2

## 2018-05-28 MED ORDER — CYANOCOBALAMIN 1000 MCG/ML IJ SOLN
1000.0000 ug | Freq: Once | INTRAMUSCULAR | Status: DC
  Administered 2018-05-28: 1000 ug via INTRAMUSCULAR
  Filled 2018-05-28: qty 1

## 2018-05-28 MED ORDER — ENOXAPARIN SODIUM 30 MG/0.3ML ~~LOC~~ SOLN
30.0000 mg | SUBCUTANEOUS | Status: DC
  Administered 2018-05-28 – 2018-05-30 (×3): 30 mg via SUBCUTANEOUS
  Filled 2018-05-28 (×3): qty 0.3

## 2018-05-28 MED ORDER — CHLORHEXIDINE GLUCONATE 0.12 % MT SOLN
15.0000 mL | Freq: Two times a day (BID) | OROMUCOSAL | Status: DC
  Administered 2018-05-28 – 2018-05-31 (×4): 15 mL via OROMUCOSAL
  Filled 2018-05-28 (×4): qty 15

## 2018-05-28 MED ORDER — HALOPERIDOL 5 MG PO TABS
5.0000 mg | ORAL_TABLET | Freq: Four times a day (QID) | ORAL | Status: DC | PRN
  Filled 2018-05-28: qty 1

## 2018-05-28 MED ORDER — LORAZEPAM 2 MG/ML IJ SOLN
1.0000 mg | Freq: Four times a day (QID) | INTRAMUSCULAR | Status: AC | PRN
  Administered 2018-05-28: 1 mg via INTRAVENOUS
  Filled 2018-05-28: qty 1

## 2018-05-28 MED ORDER — LORAZEPAM 1 MG PO TABS
1.0000 mg | ORAL_TABLET | Freq: Four times a day (QID) | ORAL | Status: AC | PRN
  Administered 2018-05-28: 1 mg via ORAL
  Filled 2018-05-28: qty 1

## 2018-05-28 MED ORDER — ADULT MULTIVITAMIN W/MINERALS CH
1.0000 | ORAL_TABLET | Freq: Every day | ORAL | Status: DC
  Administered 2018-05-28 – 2018-05-31 (×4): 1 via ORAL
  Filled 2018-05-28 (×4): qty 1

## 2018-05-28 MED ORDER — VITAMIN B-1 100 MG PO TABS
100.0000 mg | ORAL_TABLET | Freq: Every day | ORAL | Status: DC
  Administered 2018-05-28 – 2018-05-31 (×4): 100 mg via ORAL
  Filled 2018-05-28 (×5): qty 1

## 2018-05-28 MED ORDER — PNEUMOCOCCAL VAC POLYVALENT 25 MCG/0.5ML IJ INJ
0.5000 mL | INJECTION | INTRAMUSCULAR | Status: AC
  Administered 2018-05-29: 0.5 mL via INTRAMUSCULAR
  Filled 2018-05-28: qty 0.5

## 2018-05-28 MED ORDER — OLANZAPINE 2.5 MG PO TABS
2.5000 mg | ORAL_TABLET | Freq: Two times a day (BID) | ORAL | Status: DC | PRN
  Filled 2018-05-28: qty 1

## 2018-05-28 MED ORDER — CYANOCOBALAMIN 1000 MCG/ML IJ SOLN: 1000.0000 ug | Freq: Every day | INTRAMUSCULAR | Status: DC

## 2018-05-28 MED ORDER — HALOPERIDOL LACTATE 5 MG/ML IJ SOLN
5.0000 mg | Freq: Four times a day (QID) | INTRAMUSCULAR | Status: DC | PRN
  Administered 2018-05-28: 5 mg via INTRAMUSCULAR
  Filled 2018-05-28: qty 1

## 2018-05-28 MED ORDER — ORAL CARE MOUTH RINSE
15.0000 mL | Freq: Two times a day (BID) | OROMUCOSAL | Status: DC
  Administered 2018-05-28: 15 mL via OROMUCOSAL

## 2018-05-28 MED ORDER — SODIUM CHLORIDE 0.9 % IV SOLN
INTRAVENOUS | Status: DC
  Administered 2018-05-28 (×2): via INTRAVENOUS

## 2018-05-28 MED ORDER — ACETAMINOPHEN 650 MG RE SUPP: 650.0000 mg | Freq: Four times a day (QID) | RECTAL | Status: DC | PRN

## 2018-05-28 MED ORDER — OLANZAPINE 2.5 MG PO TABS
2.5000 mg | ORAL_TABLET | Freq: Every day | ORAL | Status: DC
  Administered 2018-05-28 – 2018-05-29 (×2): 2.5 mg via ORAL
  Filled 2018-05-28 (×2): qty 1

## 2018-05-28 MED ORDER — ENOXAPARIN SODIUM 40 MG/0.4ML ~~LOC~~ SOLN: 40.0000 mg | SUBCUTANEOUS | Status: DC

## 2018-05-28 MED ORDER — MAGNESIUM SULFATE 2 GM/50ML IV SOLN
2.0000 g | Freq: Once | INTRAVENOUS | Status: AC
  Administered 2018-05-28: 2 g via INTRAVENOUS
  Filled 2018-05-28 (×3): qty 50

## 2018-05-28 NOTE — Consult Note (Signed)
[facility] Consult   Reason for Consult:  Hearing voices  Referring Physician:  Dr. Eliseo Squires Patient Identification: [name] MRN:  [id number] Principal Diagnosis: Altered mental status Diagnosis:   Patient Active Problem List   Diagnosis Date Noted  . Abnormal liver function [R94.5]   . ARF (acute renal failure) ([place]) [N17.9]   . Altered mental status [R41.82] 05/27/2018    Total Time spent with patient: 1 hour  Subjective:   [name] is a 66 y.o. female patient admitted with AMS secondary to UTI.  HPI:   Per chart review, patient was admitted with AMS secondary to UTI. She was found in a hotel room where she had barricaded herself. She is prescribed Rocephin 1 g daily. She received Ativan 1 mg this morning for CIWA protocol. Psychiatry was consulted for AH.  On interview, [name] is only oriented to self. She is mostly nonsensical and responds with with illogical answers to questions. She denies a psychiatric history although per nursing she reported heavy alcohol use earlier today when she was lucid. She reported drinking up to 1 pint of alcohol daily. She became agitated earlier and attempted to leave the hospital. She was unstable on her feet. Her brother was contacted by phone but he was unavailable so a message was left on his voicemail to contact this notewriter regarding his sister.    Past Psychiatric History: Alcohol abuse   Risk to Self:  UTA due to AMS.  Risk to Others:  UTA due to AMS.  Prior Inpatient Therapy:  UTA due to AMS. None per chart review.  Prior Outpatient Therapy:  UTA due to AMS. None per chart review.   Past Medical History:  Past Medical History:  Diagnosis Date  . Hypercholesteremia   . Hypertension   . Vascular disease peripheral    Past Surgical History:  Procedure Laterality Date  . ANGIOPLASTY  patch angioplasty of right femoral artery.  Marland Kitchen aortogram  with right lower extremity runoff  . ENDARTERECTOMY   endarterectomy of right external iliac and common femoral artery.  . FEMORAL EMBOLOECTOMY  right  . HIP SURGERY Right 2012   per pt   Family History:  Family History  Problem Relation Age of Onset  . Heart disease Mother   . Breast cancer Mother   . Diabetes Mother   . Heart attack Father   . Down syndrome Brother   . Heart attack Brother    Family Psychiatric  History: Unknown  Social History:  Social History   Substance and Sexual Activity  Alcohol Use Yes  . Alcohol/week: 1.8 oz  . Types: 3 Shots of liquor per week   Comment: every other day     Social History   Substance and Sexual Activity  Drug Use Never    Social History   Socioeconomic History  . Marital status: Divorced    Spouse name: Not on file  . Number of children: Not on file  . Years of education: 25  . Highest education level: Bachelor's degree (e.g., BA, AB, BS)  Occupational History  . Occupation: Gaffer  Social Needs  . Financial resource strain: Not very hard  . Food insecurity:    Worry: Patient refused    Inability: Patient refused  . Transportation needs:    Medical: Patient refused    Non-medical: Patient refused  Tobacco Use  . Smoking status: Former Smoker    Packs/day: 0.50    Years: 20.00    Pack years:  10.00    Last attempt to quit: 12/27/2010    Years since quitting: 7.4  . Smokeless tobacco: Never Used  Substance and Sexual Activity  . Alcohol use: Yes    Alcohol/week: 1.8 oz    Types: 3 Shots of liquor per week    Comment: every other day  . Drug use: Never  . Sexual activity: Not Currently  Lifestyle  . Physical activity:    Days per week: 7 days    Minutes per session: 120 min  . Stress: Only a little  Relationships  . Social connections:    Talks on phone: Once a week    Gets together: Once a week    Attends religious service: Never    Active member of club or organization: No    Attends meetings of clubs or organizations: Never    Relationship  status: Divorced  Other Topics Concern  . Not on file  Social History Narrative  . Not on file   Additional Social History: N/A    Allergies:  No Known Allergies  Labs:  Results for orders placed or performed during the hospital encounter of 05/27/18 (from the past 48 hour(s))  Comprehensive metabolic panel     Status: Abnormal   Collection Time: 05/27/18  5:13 PM  Result Value Ref Range   Sodium 142 135 - 145 mmol/L   Potassium 3.7 3.5 - 5.1 mmol/L   Chloride 104 101 - 111 mmol/L   CO2 21 (L) 22 - 32 mmol/L   Glucose, Bld 188 (H) 65 - 99 mg/dL   BUN 25 (H) 6 - 20 mg/dL   Creatinine, Ser 2.60 (H) 0.44 - 1.00 mg/dL   Calcium 10.3 8.9 - 10.3 mg/dL   Total Protein 8.6 (H) 6.5 - 8.1 g/dL   Albumin 4.2 3.5 - 5.0 g/dL   AST 69 (H) 15 - 41 U/L   ALT 30 14 - 54 U/L   Alkaline Phosphatase 72 38 - 126 U/L   Total Bilirubin 1.7 (H) 0.3 - 1.2 mg/dL   GFR calc non Af Amer 18 (L) >60 mL/min   GFR calc Af Amer 21 (L) >60 mL/min    Comment: (NOTE) The eGFR has been calculated using the CKD EPI equation. This calculation has not been validated in all clinical situations. eGFR's persistently <60 mL/min signify possible Chronic Kidney Disease.    Anion gap 17 (H) 5 - 15    Comment: Performed at [facility], [place] [address] [place], [place] [address]  CBC     Status: Abnormal   Collection Time: 05/27/18  5:13 PM  Result Value Ref Range   WBC 17.7 (H) 4.0 - 10.5 K/uL   RBC 5.24 (H) 3.87 - 5.11 MIL/uL   Hemoglobin 14.5 12.0 - 15.0 g/dL   HCT 44.7 36.0 - 46.0 %   MCV 85.3 78.0 - 100.0 fL   MCH 27.7 26.0 - 34.0 pg   MCHC 32.4 30.0 - 36.0 g/dL   RDW 16.8 (H) 11.5 - 15.5 %   Platelets 334 150 - 400 K/uL    Comment: Performed at [facility], [place] [address] [place], [place] [address]  CBG monitoring, ED     Status: Abnormal   Collection Time: 05/27/18  5:26 PM  Result Value Ref Range   Glucose-Capillary 171 (H) 65 - 99 mg/dL  Ethanol     Status: None   Collection Time:  05/27/18  6:03 PM  Result Value Ref Range  Alcohol, Ethyl (B) <10 <10 mg/dL    Comment: (NOTE) Lowest detectable limit for serum alcohol is 10 mg/dL. For medical purposes only. Performed at [facility], [place] [address] [place], [place] [address]   Acetaminophen level     Status: None   Collection Time: 05/27/18  6:03 PM  Result Value Ref Range   Acetaminophen (Tylenol), Serum 15 10 - 30 ug/mL    Comment: (NOTE) Therapeutic concentrations vary significantly. A range of 10-30 ug/mL  may be an effective concentration for many patients. However, some  are best treated at concentrations outside of this range. Acetaminophen concentrations >150 ug/mL at 4 hours after ingestion  and >50 ug/mL at 12 hours after ingestion are often associated with  toxic reactions. Performed at [facility], [place] [address], [place], [place] [address]   Salicylate level     Status: None   Collection Time: 05/27/18  6:03 PM  Result Value Ref Range   Salicylate Lvl <1.4 2.8 - 30.0 mg/dL    Comment: Performed at [facility] [address], [place], [place] [address]  Ammonia     Status: Abnormal   Collection Time: 05/27/18  6:10 PM  Result Value Ref Range   Ammonia <9 (L) 9 - 35 umol/L    Comment: Performed at [facility], [place] [address], [place], [place] [address]  Urinalysis, Routine w reflex microscopic     Status: Abnormal   Collection Time: 05/27/18  8:10 PM  Result Value Ref Range   Color, Urine YELLOW YELLOW   APPearance HAZY (A) CLEAR   Specific Gravity, Urine 1.013 1.005 - 1.030   pH 5.0 5.0 - 8.0   Glucose, UA NEGATIVE NEGATIVE mg/dL   Hgb urine dipstick MODERATE (A) NEGATIVE   Bilirubin Urine NEGATIVE NEGATIVE   Ketones, ur NEGATIVE NEGATIVE mg/dL   Protein, ur 100 (A) NEGATIVE mg/dL   Nitrite NEGATIVE NEGATIVE   Leukocytes, UA SMALL (A) NEGATIVE   RBC / HPF 11-20 0 - 5 RBC/hpf   WBC, UA 21-50 0 - 5 WBC/hpf   Bacteria, UA NONE SEEN NONE SEEN   Squamous  Epithelial / LPF 0-5 0 - 5   Renal Epithelial 1    Mucus PRESENT    Hyaline Casts, UA PRESENT     Comment: Performed at [facility], [place] [address] [place], [place] [address]  Rapid urine drug screen (hospital performed)     Status: Abnormal   Collection Time: 05/27/18  8:10 PM  Result Value Ref Range   Opiates NONE DETECTED NONE DETECTED   Cocaine NONE DETECTED NONE DETECTED   Benzodiazepines NONE DETECTED NONE DETECTED   Amphetamines NONE DETECTED NONE DETECTED   Tetrahydrocannabinol NONE DETECTED NONE DETECTED   Barbiturates (A) NONE DETECTED    Result not available. Reagent lot number recalled by manufacturer.    Comment: Performed at [facility], [place] [address], [place], [place] [address]  Sodium, urine, random     Status: None   Collection Time: 05/27/18  8:10 PM  Result Value Ref Range   Sodium, Ur 123 mmol/L    Comment: Performed at [facility] [address] [place], [place] [address]  Culture, Urine     Status: None (Preliminary result)   Collection Time: 05/27/18  8:10 PM  Result Value Ref Range   Specimen Description      URINE, RANDOM Performed at [facility], [place] [address],  [place], [place] [address]    Special Requests IN STERILE CONTAINER    Culture PENDING    Report Status PENDING   Vitamin B12     Status: Abnormal   Collection Time: 05/27/18 10:54 PM  Result Value Ref Range   Vitamin B-12 130 (L) 180 - 914 pg/mL    Comment: (NOTE) This assay is not validated for testing neonatal or myeloproliferative syndrome specimens for Vitamin B12 levels. Performed at [facility], [place] [address], [place], [place] [address]   Sedimentation rate     Status: Abnormal   Collection Time: 05/27/18 10:54 PM  Result Value Ref Range   Sed Rate 23 (H) 0 - 22 mm/hr    Comment: Performed at [facility] [address], [place], [place] [address]  TSH     Status: None   Collection Time: 05/27/18 10:54 PM  Result Value Ref Range    TSH 0.508 0.350 - 4.500 uIU/mL    Comment: Performed by a 3rd Generation assay with a functional sensitivity of <=0.01 uIU/mL. Performed at [facility], [place] [address] [place], [place] [address]   Comprehensive metabolic panel     Status: Abnormal   Collection Time: 05/28/18  4:49 AM  Result Value Ref Range   Sodium 141 135 - 145 mmol/L   Potassium 4.0 3.5 - 5.1 mmol/L   Chloride 110 101 - 111 mmol/L   CO2 15 (L) 22 - 32 mmol/L   Glucose, Bld 120 (H) 65 - 99 mg/dL   BUN 22 (H) 6 - 20 mg/dL   Creatinine, Ser 2.00 (H) 0.44 - 1.00 mg/dL   Calcium 6.9 (L) 8.9 - 10.3 mg/dL    Comment: DELTA CHECK NOTED   Total Protein 7.0 6.5 - 8.1 g/dL   Albumin 3.5 3.5 - 5.0 g/dL   AST 83 (H) 15 - 41 U/L   ALT 37 14 - 54 U/L   Alkaline Phosphatase 58 38 - 126 U/L   Total Bilirubin 1.2 0.3 - 1.2 mg/dL   GFR calc non Af Amer 25 (L) >60 mL/min   GFR calc Af Amer 29 (L) >60 mL/min    Comment: (NOTE) The eGFR has been calculated using the CKD EPI equation. This calculation has not been validated in all clinical situations. eGFR's persistently <60 mL/min signify possible Chronic Kidney Disease.    Anion gap 16 (H) 5 - 15    Comment: Performed at [facility], [place] [address] [place], [place] [address]  CBC     Status: Abnormal   Collection Time: 05/28/18  4:49 AM  Result Value Ref Range   WBC 13.8 (H) 4.0 - 10.5 K/uL   RBC 4.56 3.87 - 5.11 MIL/uL   Hemoglobin 12.6 12.0 - 15.0 g/dL   HCT 39.8 36.0 - 46.0 %   MCV 87.3 78.0 - 100.0 fL   MCH 27.6 26.0 - 34.0 pg   MCHC 31.7 30.0 - 36.0 g/dL   RDW 17.1 (H) 11.5 - 15.5 %   Platelets 277 150 - 400 K/uL    Comment: Performed at [facility], [place] [address] [place], [place] [address]    Current Facility-Administered Medications  Medication Dose Route Frequency Provider Last Rate Last Dose  . acetaminophen (TYLENOL) tablet 650 mg  650 mg Oral Q6H PRN Jani Gravel, MD       Or  . acetaminophen (TYLENOL) suppository 650 mg  650 mg  Rectal Q6H PRN Jani Gravel, MD      .  cefTRIAXone (ROCEPHIN) 1 g in sodium chloride 0.9 % 100 mL IVPB  1 g Intravenous Q24H Jani Gravel, MD   Stopped at 05/27/18 2340  . chlorhexidine (PERIDEX) 0.12 % solution 15 mL  15 mL Mouth Rinse BID Jani Gravel, MD   15 mL at 05/28/18 0945  . enoxaparin (LOVENOX) injection 30 mg  30 mg Subcutaneous Q24H Jani Gravel, MD   30 mg at 05/28/18 0945  . folic acid (FOLVITE) tablet 1 mg  1 mg Oral Daily Vann, Jessica U, DO   1 mg at 05/28/18 1053  . LORazepam (ATIVAN) tablet 1 mg  1 mg Oral Q6H PRN Eulogio Bear U, DO       Or  . LORazepam (ATIVAN) injection 1 mg  1 mg Intravenous Q6H PRN Eulogio Bear U, DO      . MEDLINE mouth rinse  15 mL Mouth Rinse q12n4p Jani Gravel, MD      . multivitamin with minerals tablet 1 tablet  1 tablet Oral Daily Eulogio Bear U, DO   1 tablet at 05/28/18 1054  . [START ON 05/29/2018] pneumococcal 23 valent vaccine (PNU-IMMUNE) injection 0.5 mL  0.5 mL Intramuscular Tomorrow-1000 Jani Gravel, MD      . thiamine (VITAMIN B-1) tablet 100 mg  100 mg Oral Daily Vann, Jessica U, DO   100 mg at 05/28/18 1053   Or  . thiamine (B-1) injection 100 mg  100 mg Intravenous Daily Vann, Jessica U, DO      . vitamin B-12 (CYANOCOBALAMIN) tablet 1,000 mcg  1,000 mcg Oral Daily Eulogio Bear U, DO   1,000 mcg at 05/28/18 1053    Musculoskeletal: Strength & Muscle Tone: UTA due to AMS. Gait & Station: unable to stand Patient leans: N/A  Psychiatric Specialty Exam: Physical Exam  Nursing note and vitals reviewed. Constitutional: She appears well-developed and well-nourished.  HENT:  Head: Normocephalic and atraumatic.  Neck: Normal range of motion.  Respiratory: Effort normal.  Musculoskeletal: Normal range of motion.  Neurological: She is alert.  Only oriented to self.  Skin: No rash noted.  Psychiatric: Thought content normal. Her affect is inappropriate. Her speech is slurred. She is slowed. Cognition and memory are impaired. She expresses  impulsivity.    Review of Systems  Unable to perform ROS: Mental status change    Blood pressure 122/62, pulse (!) 58, temperature 98.2 F (36.8 C), temperature source Oral, resp. rate 20, height 5' 6"  (1.676 m), weight 68 kg (150 lb), SpO2 99 %.Body mass index is 24.21 kg/m.  General Appearance: Disheveled, elderly, Caucasian female, wearing a hospital gown with short hair and poor dentition who is lying in bed. NAD.   Eye Contact:  Poor due to falling asleep throughout interview.   Speech:  Slurred  Volume:  Normal  Mood:  Euthymic  Affect:  Constricted  Thought Process:  Disorganized and Descriptions of Associations: Loose  Orientation:  Other:  Only oriented to self.  Thought Content:  Illogical  Suicidal Thoughts:  UTA due to AMS.  Homicidal Thoughts:  UTA due to AMS.  Memory:  Immediate;   Poor Recent;   Poor Remote;   Poor  Judgement:  Impaired  Insight:  Lacking  Psychomotor Activity:  Decreased  Concentration:  Concentration: Poor and Attention Span: Poor  Recall:  Poor  Fund of Knowledge:  Poor  Language:  Fair  Akathisia:  No  Handed:  Right  AIMS (if indicated):   N/A  Assets:  Social Support  ADL's:  Impaired  Cognition:  Impaired due to AMS.  Sleep:   N/A   Assessment:  [name] is a 67 y.o. female who was admitted with AMS thought secondary to UTI. She reportedly has been waxing and waning in mental status since admission. She is only oriented to person and otherwise nonsensical so history was unable to be obtained. She reportedly has a history of heavy alcohol use and may be experiencing alcohol withdrawal DTs. Recommend continuing CIWA protocol. Zyprexa may help with agitation/psychosis secondary to alcohol withdrawal.   Treatment Plan Summary: -Continue CIWA protocol with Ativan for possible alcohol withdrawal given reported history of heavy use. -Start Zyprexa 2.5 mg qhs and 2.5 mg daily PRN for psychosis/agitation. -Please obtain EKG to closely  monitor for QTc prolongation when starting or increasing QTc prolonging agents.  -Psychiatry will sign off on patient at this time. Please consult psychiatry again as needed.   Disposition: Patient does not meet criteria for psychiatric inpatient admission.  Faythe Dingwall, DO 05/28/2018 10:54 AM

## 2018-05-28 NOTE — H&P (Addendum)
TRH H&P   Patient Demographics:    [name], is a 67 y.o. female  MRN: [id number]   DOB - September 20, 1951  Admit Date - 05/27/2018  Outpatient Primary MD for the patient is Briana Sheller, MD  Referring MD/NP/PA: Jillyn Ledger PA  Outpatient Specialists:    Patient coming from: hotel  Chief Complaint  Patient presents with  . Altered Mental Status      HPI:    [name]  is a 67 y.o. female, w hypertension, hyperlipidemia, pvd,  apparently presents due to being found in hotel room barricaded.  Pt apparently is acting strange.  She is axox3. (person, place, year), however she is acting very peculiar. She states that she was living in the hotel because she just sold her house in [place].  Pt was brought by EMS to ED.  Pt denies etoh use or drug use.   In ED,  CT brain  IMPRESSION: 1.  No acute intracranial abnormality.  CXR IMPRESSION: 1. Poor inspiration with otherwise stable changes of COPD. 2. Increased density at the left lung base, most likely due to accentuation of an epicardial fat pad due to the poor inspiration.   Na 142, K 3.7 Bun 25, Creatinne 2.60 Ast 69, Alt 30, Alk phos 72, T. Bili 1.7 Wbc 17.7, Hgb 14.5, Plt 334  etoh <10 Tylenol level 15 Salicylate level <1.7 Ammonia <9  Urinalysis wbc 21-50, rbc 11-20 UDS negative  TSH 0.508  Pt will be admitted for altered mental status secondary to UTI.     Review of systems:    In addition to the HPI above, unable to give accurate history due to AMS  No Fever-chills, No Headache, No changes with Vision or hearing, No problems swallowing food or Liquids, No Chest pain, Cough or Shortness of Breath, No Abdominal pain, No Nausea or Vommitting, Bowel movements are regular, No Blood in stool or Urine, No dysuria, No new skin rashes or bruises, No new joints pains-aches,  No new weakness, tingling,  numbness in any extremity, No recent weight gain or loss, No polyuria, polydypsia or polyphagia, No significant Mental Stressors.  A full 10 point Review of Systems was done, except as stated above, all other Review of Systems were negative.   With Past History of the following :    Past Medical History:  Diagnosis Date  . Hypercholesteremia   . Hypertension   . Vascular disease peripheral      Past Surgical History:  Procedure Laterality Date  . ANGIOPLASTY  patch angioplasty of right femoral artery.  Marland Kitchen aortogram  with right lower extremity runoff  . ENDARTERECTOMY  endarterectomy of right external iliac and common femoral artery.  . FEMORAL EMBOLOECTOMY  right      Social History:     Social History   Tobacco Use  . Smoking status: Former Smoker    Packs/day: 0.50  Years: 20.00    Pack years: 10.00    Last attempt to quit: 12/27/2010    Years since quitting: 7.4  Substance Use Topics  . Alcohol use: Yes    Comment: She does not consume alcohol regularly.     Lives - at hotel  Mobility - walks by self   Family History :     Family History  Problem Relation Age of Onset  . Heart disease Mother        Home Medications:   Prior to Admission medications   Medication Sig Start Date End Date Taking? Authorizing Provider  aspirin 81 MG tablet Take 81 mg by mouth daily.      [provider]  Cholecalciferol (VITAMIN D3) 5000 UNITS CAPS Take by mouth daily.      [provider]  cilostazol (PLETAL) 100 MG tablet Take 100 mg by mouth 2 (two) times daily.      [provider]  dabigatran (PRADAXA) 150 MG CAPS Take 150 mg by mouth every 12 (twelve) hours.      [provider]  Multiple Vitamin (MULTIVITAMIN) capsule Take 1 capsule by mouth daily.      [provider]  Olmesartan-Amlodipine-HCTZ (TRIBENZOR) 40-10-12.5 MG TABS Take by mouth daily.      [provider]  Pitavastatin Calcium (LIVALO) 2 MG TABS  Take 2 mg by mouth daily.      [provider]     Allergies:    No Known Allergies   Physical Exam:   Vitals  Blood pressure (!) 169/99, pulse 91, temperature 98.2 F (36.8 C), temperature source Oral, resp. rate 18, height 5' 6"  (1.676 m), weight 68 kg (150 lb), SpO2 99 %.   1. General  lying in bed in NAD,    2. Normal affect and insight, Not Suicidal or Homicidal, Awake Alert, Oriented X 3.  3. No F.N deficits, ALL C.Nerves Intact, Strength 5/5 all 4 extremities, Sensation intact all 4 extremities, Plantars down going.  4. Ears and Eyes appear Normal, Conjunctivae clear, PERRLA. Moist Oral Mucosa.  5. Supple Neck, No JVD, No cervical lymphadenopathy appriciated, No Carotid Bruits.  6. Symmetrical Chest wall movement, Good air movement bilaterally, CTAB.  7. RRR, No Gallops, Rubs or Murmurs, No Parasternal Heave.  8. Positive Bowel Sounds, Abdomen Soft, No tenderness, No organomegaly appriciated,No rebound -guarding or rigidity.  9.  No Cyanosis, Normal Skin Turgor, No Skin Rash or Bruise.  10. Good muscle tone,  joints appear normal , no effusions, Normal ROM.  11. No Palpable Lymph Nodes in Neck or Axillae     Data Review:    CBC Recent Labs  Lab 05/27/18 1713  WBC 17.7*  HGB 14.5  HCT 44.7  PLT 334  MCV 85.3  MCH 27.7  MCHC 32.4  RDW 16.8*   ------------------------------------------------------------------------------------------------------------------  Chemistries  Recent Labs  Lab 05/27/18 1713  NA 142  K 3.7  CL 104  CO2 21*  GLUCOSE 188*  BUN 25*  CREATININE 2.60*  CALCIUM 10.3  AST 69*  ALT 30  ALKPHOS 72  BILITOT 1.7*   ------------------------------------------------------------------------------------------------------------------ estimated creatinine clearance is 19.7 mL/min (A) (by C-G formula based on SCr of 2.6 mg/dL  (H)). ------------------------------------------------------------------------------------------------------------------ Recent Labs    05/27/18 2254  TSH 0.508    Coagulation profile No results for input(s): INR, PROTIME in the last 168 hours. ------------------------------------------------------------------------------------------------------------------- No results for input(s): DDIMER in the last 72 hours. -------------------------------------------------------------------------------------------------------------------  Cardiac Enzymes No results for input(s):  CKMB, TROPONINI, MYOGLOBIN in the last 168 hours.  Invalid input(s): CK ------------------------------------------------------------------------------------------------------------------ No results found for: BNP   ---------------------------------------------------------------------------------------------------------------  Urinalysis    Component Value Date/Time   COLORURINE YELLOW 05/27/2018 2010   APPEARANCEUR HAZY (A) 05/27/2018 2010   LABSPEC 1.013 05/27/2018 2010   PHURINE 5.0 05/27/2018 2010   GLUCOSEU NEGATIVE 05/27/2018 2010   HGBUR MODERATE (A) 05/27/2018 2010   BILIRUBINUR NEGATIVE 05/27/2018 2010   KETONESUR NEGATIVE 05/27/2018 2010   PROTEINUR 100 (A) 05/27/2018 2010   UROBILINOGEN 0.2 02/01/2011 1135   NITRITE NEGATIVE 05/27/2018 2010   LEUKOCYTESUR SMALL (A) 05/27/2018 2010    ----------------------------------------------------------------------------------------------------------------   Imaging Results:    Dg Chest 2 View  Result Date: 05/27/2018 CLINICAL DATA:  Altered mental status. EXAM: CHEST - 2 VIEW COMPARISON:  02/01/2011. FINDINGS: Poor inspiration. Interval ill-defined opacity at the left lung base anteriorly. Mildly prominent interstitial markings, accentuated by the poor inspiration. Diffuse osteopenia. IMPRESSION: 1. Poor inspiration with otherwise stable changes of COPD. 2.  Increased density at the left lung base, most likely due to accentuation of an epicardial fat pad due to the poor inspiration. Electronically Signed   By: Claudie Revering M.D.   On: 05/27/2018 20:59   Ct Head Wo Contrast  Result Date: 05/27/2018 CLINICAL DATA:  Altered mental status. EXAM: CT HEAD WITHOUT CONTRAST TECHNIQUE: Contiguous axial images were obtained from the base of the skull through the vertex without intravenous contrast. COMPARISON:  MR brain dated May 17, 2011. FINDINGS: Brain: No evidence of acute infarction, hemorrhage, hydrocephalus, extra-axial collection or mass lesion/mass effect. 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 Stable mild atrophy. Vascular: Atherosclerotic vascular calcification of the carotid siphons. No hyperdense vessel. Skull: Negative for fracture or focal lesion. Sinuses/Orbits: No acute finding. Other: None. IMPRESSION: 1.  No acute intracranial abnormality. Electronically Signed   By: Titus Dubin M.D.   On: 05/27/2018 19:06       Assessment & Plan:    Active Problems:   Altered mental status    AMS secondary to UTI ddx b12 deficiency , CVA MRI brain Check b12, folate, esr, ana, tsh, rpr Tx UTI with rocephin 1gm iv qday  UTI Await Urine culture Start Rocephin 1gm iv qday  ARF STOP Check urine sodium, urine creatinine , urine eosinophils Check renal ultrasound Hydrate with ns iv  B12 deficiency Start vitamin b12 1000 micrograms im qday x 7 days  PVD (long segment occulsion of right external iliac artery, common femoral artery and long segment occulsion of the right superficial femoral artery s/p thrombectomy of the external iliac, and common femoral artery along with profunda femoral artery , and patch angioplasty by Ruta Hinds in 2012 ) Please try to find out once AMS improved Whether taking pletal, pradaxa, livalo  ??? Please consider calling Adrian Prows in AM who apparently was involved in prior tx  Abnormal liver  function Check acute hepatitis panel Check RUQ ultrasound  DVT Prophylaxis   Lovenox - SCDs   AM Labs Ordered, also please review Full Orders  Family Communication: Admission, patients condition and plan of care including tests being ordered have been discussed with the patient  who indicate understanding and agree with the plan and Code Status.  Code Status  FULL CODE  Likely DC to  home  Condition GUARDED   Consults called:  none  Admission status: inpatient  Time spent in minutes : 60   Jani Gravel M.D on 05/28/2018 at 12:13 AM  Between 7am to 7pm -  Pager - [phone] . After 7pm go to [email or web address] - password [email or web address]  [facility] - Office  [phone]

## 2018-05-28 NOTE — Progress Notes (Signed)
Progress Note    [name]  ZOX:[id number] DOB: [birth year]  DOA: 05/27/2018 PCP: No primary care provider on file.    Brief Narrative:    Medical records reviewed and are as summarized below:  [name] is an 67 y.o. female w hypertension, hyperlipidemia, pvd,  apparently presents due to being found in hotel room barricaded.  Pt apparently is acting strange.  She is axox3. (person, place, year), however she is acting very peculiar. She states that she was living in the hotel because she just sold her house in [place].  Told nurse she drinks alcohol-- 1 pint/day.  Per EMS had barricaded herself in her hotel room.    Assessment/Plan:   Active Problems:   Altered mental status   Abnormal liver function   ARF (acute renal failure) (HCC)  AMS with hallucinations -B12 low-- replace PO -psych consult for auditory hallucinations --- reported by nursing -MRI with chronic changes-- no acute issue  ? UTI Await Urine culture Start Rocephin 1gm iv qday per admitting dr  AKI on CKD stage 3 -last Cr was 1.5 6 years ago  B12 deficiency Start vitamin b12 PO-- refused injection  PVD (long segment occulsion of right external iliac artery, common femoral artery and long segment occulsion of the right superficial femoral artery s/p thrombectomy of the external iliac, and common femoral artery along with profunda femoral artery , and patch angioplasty by Fabienne Bruns in 2012 ) -not on any medications  Abnormal liver function -? Alcohol abuse  Alcohol abuse -CIWA protocol      Family Communication/Anticipated D/C date and plan/Code Status   DVT prophylaxis: Lovenox ordered. Code Status: Full Code.  Family Communication:  Disposition Plan: pending work up   Medical Consultants:    psych    Subjective:   Hard of hearing-- not sure why she is here  Objective:    Vitals:   05/27/18 1930 05/27/18 2200 05/28/18 0049 05/28/18 0809  BP: (!) 177/96 (!)  169/99 (!) 181/108 122/62  Pulse: 89 91 94 (!) 58  Resp: 20 18 19 20   Temp:   98.8 F (37.1 C) 98.2 F (36.8 C)  TempSrc:   Oral Oral  SpO2: 99% 99% 100% 99%  Weight:      Height:        Intake/Output Summary (Last 24 hours) at 05/28/2018 1231 Last data filed at 05/28/2018 0600 Gross per 24 hour  Intake 603.43 ml  Output -  Net 603.43 ml   Filed Weights   05/27/18 1658  Weight: 68 kg (150 lb)    Exam: Guarded, in bed NAD Clear, no wheezing rrr +BS soft No rashes or lesions  Data Reviewed:   I have personally reviewed following labs and imaging studies:  Labs: Labs show the following:   Basic Metabolic Panel: Recent Labs  Lab 05/27/18 1713 05/28/18 0449  NA 142 141  K 3.7 4.0  CL 104 110  CO2 21* 15*  GLUCOSE 188* 120*  BUN 25* 22*  CREATININE 2.60* 2.00*  CALCIUM 10.3 6.9*   GFR Estimated Creatinine Clearance: 25.6 mL/min (A) (by C-G formula based on SCr of 2 mg/dL (H)). Liver Function Tests: Recent Labs  Lab 05/27/18 1713 05/28/18 0449  AST 69* 83*  ALT 30 37  ALKPHOS 72 58  BILITOT 1.7* 1.2  PROT 8.6* 7.0  ALBUMIN 4.2 3.5   No results for input(s): LIPASE, AMYLASE in the last 168 hours. Recent Labs  Lab 05/27/18 1810  AMMONIA <9*  Coagulation profile No results for input(s): INR, PROTIME in the last 168 hours.  CBC: Recent Labs  Lab 05/27/18 1713 05/28/18 0449  WBC 17.7* 13.8*  HGB 14.5 12.6  HCT 44.7 39.8  MCV 85.3 87.3  PLT 334 277   Cardiac Enzymes: No results for input(s): CKTOTAL, CKMB, CKMBINDEX, TROPONINI in the last 168 hours. BNP (last 3 results) No results for input(s): PROBNP in the last 8760 hours. CBG: Recent Labs  Lab 05/27/18 1726  GLUCAP 171*   D-Dimer: No results for input(s): DDIMER in the last 72 hours. Hgb A1c: No results for input(s): HGBA1C in the last 72 hours. Lipid Profile: No results for input(s): CHOL, HDL, LDLCALC, TRIG, CHOLHDL, LDLDIRECT in the last 72 hours. Thyroid function  studies: Recent Labs    05/27/18 2254  TSH 0.508   Anemia work up: Recent Labs    05/27/18 2254  VITAMINB12 130*   Sepsis Labs: Recent Labs  Lab 05/27/18 1713 05/28/18 0449  WBC 17.7* 13.8*    Microbiology Recent Results (from the past 240 hour(s))  Culture, Urine     Status: None (Preliminary result)   Collection Time: 05/27/18  8:10 PM  Result Value Ref Range Status   Specimen Description   Final    URINE, RANDOM Performed at [facility], [address] [place], [place] [address]    Special Requests IN STERILE CONTAINER  Final   Culture PENDING  Incomplete   Report Status PENDING  Incomplete    Procedures and diagnostic studies:  Dg Chest 2 View  Result Date: 05/27/2018 CLINICAL DATA:  Altered mental status. EXAM: CHEST - 2 VIEW COMPARISON:  02/01/2011. FINDINGS: Poor inspiration. Interval ill-defined opacity at the left lung base anteriorly. Mildly prominent interstitial markings, accentuated by the poor inspiration. Diffuse osteopenia. IMPRESSION: 1. Poor inspiration with otherwise stable changes of COPD. 2. Increased density at the left lung base, most likely due to accentuation of an epicardial fat pad due to the poor inspiration. Electronically Signed   By: Beckie Salts M.D.   On: 05/27/2018 20:59   Ct Head Wo Contrast  Result Date: 05/27/2018 CLINICAL DATA:  Altered mental status. EXAM: CT HEAD WITHOUT CONTRAST TECHNIQUE: Contiguous axial images were obtained from the base of the skull through the vertex without intravenous contrast. COMPARISON:  MR brain dated May 17, 2011. FINDINGS: Brain: No evidence of acute infarction, hemorrhage, hydrocephalus, extra-axial collection or mass lesion/mass effect. 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 Stable mild atrophy. Vascular: Atherosclerotic vascular calcification of the carotid siphons. No hyperdense vessel. Skull: Negative for fracture or focal lesion. Sinuses/Orbits: No acute finding. Other: None.  IMPRESSION: 1.  No acute intracranial abnormality. Electronically Signed   By: Obie Dredge M.D.   On: 05/27/2018 19:06   Mr Brain Wo Contrast  Result Date: 05/28/2018 CLINICAL DATA:  Initial evaluation for acute altered mental status. EXAM: MRI HEAD WITHOUT CONTRAST TECHNIQUE: Multiplanar, multiecho pulse sequences of the brain and surrounding structures were obtained without intravenous contrast. COMPARISON:  Prior CT from 05/27/2018 as well as previous MRI from 05/17/2011 FINDINGS: Brain: Diffuse prominence of the CSF containing spaces compatible with generalized age-related cerebral atrophy. Patchy and confluent T2/FLAIR hyperintensity within the periventricular and deep white matter both cerebral hemispheres, most consistent with chronic small vessel ischemic change, overall mild in nature. Encephalomalacia with gliosis within the anterior right frontal lobe consistent with remote right MCA territory infarct. No evidence for acute or subacute ischemia. No other areas of remote cortical infarction. No  acute or chronic intracranial hemorrhage. No mass lesion, midline shift or mass effect. No hydrocephalus. No extra-axial fluid collection. Major dural sinuses grossly patent. Pituitary gland within normal limits. Vascular: Major intracranial vascular flow voids maintained. Skull and upper cervical spine: Craniocervical junction within normal limits. Upper cervical spine normal. Bone marrow signal intensity within normal limits. No scalp soft tissue abnormality. Sinuses/Orbits: Globes and orbital soft tissues demonstrate no acute finding. Paranasal sinuses are clear. No significant mastoid effusion. Inner ear structures grossly normal. Other: None. IMPRESSION: 1. No acute intracranial abnormality. 2. Mild age-related cerebral atrophy with chronic small vessel ischemic disease. Remote anterior right frontal infarct. Electronically Signed   By: Rise MuBenjamin  McClintock M.D.   On: 05/28/2018 02:57   [place]  Renal  Result Date: 05/28/2018 CLINICAL DATA:  67 year old female with hypertension and acute renal failure. EXAM: RENAL / URINARY TRACT ULTRASOUND COMPLETE COMPARISON:  None. FINDINGS: Right Kidney: Length: 10.1 cm. Echogenicity within normal limits. No hydronephrosis or shadowing stone. Left Kidney: Length: 10.3 cm. Echogenicity within normal limits. No hydronephrosis or shadowing stone. Bladder: Appears normal for degree of bladder distention. IMPRESSION: Unremarkable renal ultrasound. Electronically Signed   By: Elgie CollardArash  Radparvar M.D.   On: 05/28/2018 00:39   [place] Abdomen Limited Ruq  Result Date: 05/28/2018 CLINICAL DATA:  Abnormal LFTs EXAM: ULTRASOUND ABDOMEN LIMITED RIGHT UPPER QUADRANT COMPARISON:  None. FINDINGS: Gallbladder: No gallstones or wall thickening visualized. No sonographic Murphy sign noted by sonographer. Common bile duct: Diameter: 6.8 mm Liver: No focal lesion identified. Within normal limits in parenchymal echogenicity. Portal vein is patent on color Doppler imaging with normal direction of blood flow towards the liver. IMPRESSION: Normal right upper quadrant ultrasound Electronically Signed   By: Judie PetitM.  Shick M.D.   On: 05/28/2018 08:26    Medications:   . chlorhexidine  15 mL Mouth Rinse BID  . enoxaparin (LOVENOX) injection  30 mg Subcutaneous Q24H  . folic acid  1 mg Oral Daily  . mouth rinse  15 mL Mouth Rinse q12n4p  . multivitamin with minerals  1 tablet Oral Daily  . [START ON 05/29/2018] pneumococcal 23 valent vaccine  0.5 mL Intramuscular Tomorrow-1000  . thiamine  100 mg Oral Daily   Or  . thiamine  100 mg Intravenous Daily  . vitamin B-12  1,000 mcg Oral Daily   Continuous Infusions: . cefTRIAXone (ROCEPHIN)  IV Stopped (05/27/18 2340)     LOS: 0 days   Joseph ArtJessica U Abdulahi Schor  [facility]   *Please refer to [email or web address], password [email or web address] to get updated schedule on who will round on this patient, as hospitalists switch teams weekly. If 7PM-7AM, please contact  night-coverage at [email or web address], password [email or web address] for any overnight needs.  05/28/2018, 12:31 PM

## 2018-05-28 NOTE — Progress Notes (Signed)
   05/28/18 1000  Clinical Encounter Type  Visited With Patient  Visit Type Initial  Referral From Nurse  Consult/Referral To Chaplain  Stress Factors  Patient Stress Factors None identified   Responded to a SCC for distress.  Upon entering the room the patient was sitting up in bed and welcomed the visit.  She did seem slightly confused when asked some basic questions.  Stated she has 2 brothers in town and that she lives with one of them.  Said she was feeling pretty good, but seemed unclear as to why she came to the hospital or what the medical team was going to do.  Did not seem anxious or distressed at this time.  Stated she was pleased with her care.  Will follow and support as needed. Chaplain Agustin CreeNewton Kamya Watling

## 2018-05-28 NOTE — Progress Notes (Signed)
0000: Handoff report received from RN. Pt in ED to ultrasound before coming to the floor.   [id number]: Pt arrived to floor, transferred to 3W34 bed and attempted to orient to room. Discussed plan of care for the shift; pt amenable to plan. Pt alert and oriented, but presents tangential thoughts and flight of ideas; speech is not pressured, there is no perseverating. Pt claims that she is not confused, but hard of hearing, and she does frequently repeat back something that sounds a lot like what I asked, but with different words and different meaning for which her answers are appropriate.  0400: Pt resting comfortably. Alerted on call provider to multiple and very frequent PVCs 3-6 runs at a time for a period of about 30 minutes. Pt asymptomatic, spontaneously resolved to sinus rhythm, pt sleeping.  0645: Pt to US w transport via bed.  0700: Handoff report given to RN. No acute events overnight.

## 2018-05-28 NOTE — Progress Notes (Signed)
1315 Patient agitated and aggressive, verbally and physically abusive towards this RN and staff, redirection and de-escalation efforts unsuccessful at this time. Management at bedside, Dr. Benjamine MolaVann and security paged.   1330 Ativan 1mg  admin via PIV per MD order.  1345 Haldol 5mg  admin via PIV per MD order, pt tol well, redirection successful at this time.  1400 Pt resting w/eyes closed at this time, NAD noted.

## 2018-05-29 LAB — CBC
HCT: 36.3 % (ref 36.0–46.0)
Hemoglobin: 11.9 g/dL — ABNORMAL LOW (ref 12.0–15.0)
MCH: 27.9 pg (ref 26.0–34.0)
MCHC: 32.8 g/dL (ref 30.0–36.0)
MCV: 85.2 fL (ref 78.0–100.0)
PLATELETS: 249 10*3/uL (ref 150–400)
RBC: 4.26 MIL/uL (ref 3.87–5.11)
RDW: 16.8 % — AB (ref 11.5–15.5)
WBC: 9 10*3/uL (ref 4.0–10.5)

## 2018-05-29 LAB — FOLATE RBC
FOLATE, HEMOLYSATE: 369.1 ng/mL
Folate, RBC: 849 ng/mL (ref >498)
Hematocrit: 43.5 % (ref 34.0–46.6)

## 2018-05-29 LAB — COMPREHENSIVE METABOLIC PANEL
ALBUMIN: 3.4 g/dL — AB (ref 3.5–5.0)
ALT: 268 U/L — ABNORMAL HIGH (ref 0–44)
AST: 644 U/L — ABNORMAL HIGH (ref 15–41)
Alkaline Phosphatase: 55 U/L (ref 38–126)
Anion gap: 9 (ref 5–15)
BUN: 25 mg/dL — AB (ref 8–23)
CHLORIDE: 110 mmol/L (ref 98–111)
CO2: 18 mmol/L — AB (ref 22–32)
CREATININE: 1.94 mg/dL — AB (ref 0.44–1.00)
Calcium: 8.5 mg/dL — ABNORMAL LOW (ref 8.9–10.3)
GFR calc Af Amer: 30 mL/min — ABNORMAL LOW (ref >60)
GFR calc non Af Amer: 26 mL/min — ABNORMAL LOW (ref >60)
Glucose, Bld: 91 mg/dL (ref 70–99)
Potassium: 3 mmol/L — ABNORMAL LOW (ref 3.5–5.1)
Sodium: 137 mmol/L (ref 135–145)
Total Bilirubin: 1.1 mg/dL (ref 0.3–1.2)
Total Protein: 6.9 g/dL (ref 6.5–8.1)

## 2018-05-29 LAB — URINE CULTURE

## 2018-05-29 LAB — HEMOGLOBIN A1C
HEMOGLOBIN A1C: 5.4 % (ref 4.8–5.6)
Mean Plasma Glucose: 108.28 mg/dL

## 2018-05-29 LAB — ANA: ANA: NEGATIVE

## 2018-05-29 LAB — RPR: RPR Ser Ql: NONREACTIVE

## 2018-05-29 LAB — ACETAMINOPHEN LEVEL: Acetaminophen (Tylenol), Serum: <10 ug/mL — ABNORMAL LOW (ref 10–30)

## 2018-05-29 LAB — CALCIUM / CREATININE RATIO, URINE
CREATININE, UR: 29.6 mg/dL
Calcium, Ur: 7.3 mg/dL
Calcium/Creat.Ratio: 247 mg/g creat (ref 0–260)

## 2018-05-29 LAB — HEPATITIS PANEL, ACUTE
HEP B C IGM: NEGATIVE
Hep A IgM: NEGATIVE
Hepatitis B Surface Ag: NEGATIVE

## 2018-05-29 MED ORDER — FAMOTIDINE 20 MG PO TABS
20.0000 mg | ORAL_TABLET | Freq: Every day | ORAL | Status: DC
Start: 2018-05-29 — End: 2018-05-29

## 2018-05-29 MED ORDER — POTASSIUM CHLORIDE CRYS ER 20 MEQ PO TBCR
40.0000 meq | EXTENDED_RELEASE_TABLET | Freq: Once | ORAL | Status: AC
  Administered 2018-05-29: 40 meq via ORAL
  Filled 2018-05-29: qty 2

## 2018-05-29 MED ORDER — FAMOTIDINE 20 MG PO TABS
20.0000 mg | ORAL_TABLET | Freq: Every day | ORAL | Status: DC
  Administered 2018-05-30 – 2018-05-31 (×2): 20 mg via ORAL
  Filled 2018-05-29 (×2): qty 1

## 2018-05-29 MED ORDER — FAMOTIDINE 20 MG PO TABS: 20.0000 mg | ORAL_TABLET | Freq: Two times a day (BID) | ORAL | Status: DC

## 2018-05-29 MED ORDER — MAGNESIUM SULFATE 2 GM/50ML IV SOLN
2.0000 g | Freq: Once | INTRAVENOUS | Status: AC
  Administered 2018-05-29: 2 g via INTRAVENOUS
  Filled 2018-05-29: qty 50

## 2018-05-29 MED ORDER — CALCIUM CARBONATE ANTACID 500 MG PO CHEW: 1.0000 | CHEWABLE_TABLET | Freq: Three times a day (TID) | ORAL | Status: DC | PRN

## 2018-05-29 MED ORDER — FAMOTIDINE 20 MG PO TABS
20.0000 mg | ORAL_TABLET | Freq: Every day | ORAL | Status: DC | PRN
  Administered 2018-05-29: 20 mg via ORAL
  Filled 2018-05-29: qty 1

## 2018-05-29 NOTE — Care Management Note (Signed)
Case Management Note  Patient Details  Name: [name] MRN: [id number] Date of Birth: 11/26/1951  Subjective/Objective:      Pt admitted with altered mental status. She was staying in a Motel prior to admission.               Action/Plan: CM following for d/c disposition, d/c needs, physician orders.   Expected Discharge Date:                  Expected Discharge Plan:     In-House Referral:     Discharge planning Services     Post Acute Care Choice:    Choice offered to:     DME Arranged:    DME Agency:     HH Arranged:    HH Agency:     Status of Service:  In process, will continue to follow  If discussed at Long Length of Stay Meetings, dates discussed:    Additional Comments:  Kermit BaloKelli F Derreon Consalvo, RN 05/29/2018, 10:44 AM

## 2018-05-29 NOTE — Progress Notes (Signed)
Progress Note    Kinzly Pierrelouis  ZOX:[id number] DOB: 1951-08-24  DOA: 05/27/2018 PCP: No primary care provider on file.    Brief Narrative:    Medical records reviewed and are as summarized below:  Vinie Charity is an 67 y.o. female w hypertension, hyperlipidemia, pvd,  apparently presents due to being found in hotel room barricaded.  Pt apparently is acting strange.  She is axox3. (person, place, year), however she is acting very peculiar. She states that she was living in the hotel because she just sold her house in [place].  Told nurse she drinks alcohol-- 1 pint/day.  Per EMS had barricaded herself in her hotel room.    Assessment/Plan:   Principal Problem:   Altered mental status Active Problems:   Abnormal liver function   ARF (acute renal failure) (HCC)  AMS with hallucinations-- ? Due to alcohol withdrawal -B12 low-- replace PO as she refused IM -psych consult for auditory hallucinations --- reported by nursing -MRI with chronic changes-- no acute issue  doubt UTI D/c rocephin  AKI on CKD stage 3 -last Cr was 1.5 6 years ago -trending down  B12 deficiency Start vitamin b12 PO-- refused injection  PVD (long segment occulsion of right external iliac artery, common femoral artery and long segment occulsion of the right superficial femoral artery s/p thrombectomy of the external iliac, and common femoral artery along with profunda femoral artery , and patch angioplasty by Fabienne Bruns in 2012 ) -not on any medications  Abnormal liver function- elevated AST/ALT -? Alcohol abuse -trend -RUQ U/S normal -hepatitis panel negative  Hypokalemia/hypomagnesemia -replete  Alcohol abuse -CIWA protocol      Family Communication/Anticipated D/C date and plan/Code Status   DVT prophylaxis: Lovenox ordered. Code Status: Full Code.  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  Objective:    Vitals:   05/29/18 0401 05/29/18 0423 05/29/18 0757 05/29/18 1212  BP: (!) 155/75  (!) 127/92 115/60  Pulse: 73  69 74  Resp:   16 17  Temp: 97.9 F (36.6 C)  98.2 F (36.8 C) 98 F (36.7 C)  TempSrc: Oral  Oral Oral  SpO2: 98%  99% 100%  Weight:  70.8 kg (156 lb 1.4 oz)    Height:        Intake/Output Summary (Last 24 hours) at 05/29/2018 1448 Last data filed at 05/29/2018 1205 Gross per 24 hour  Intake 1130 ml  Output -  Net 1130 ml   Filed Weights   05/27/18 1658 05/29/18 0423  Weight: 68 kg (150 lb) 70.8 kg (156 lb 1.4 oz)    Exam: Calm in bed with sitter No abd tenderness No LE edema No increased work of breathing  Data Reviewed:   I have personally reviewed following labs and imaging studies:  Labs: Labs show the following:   Basic Metabolic Panel: Recent Labs  Lab 05/27/18 1713 05/28/18 0449 05/29/18 0745  NA 142 141 137  K 3.7 4.0 3.0*  CL 104 110 110  CO2 21* 15* 18*  GLUCOSE 188* 120* 91  BUN 25* 22* 25*  CREATININE 2.60* 2.00* 1.94*  CALCIUM 10.3 6.9* 8.5*  MG  --  1.4*  --    GFR Estimated Creatinine Clearance: 26.3 mL/min (A) (  by C-G formula based on SCr of 1.94 mg/dL (H)). Liver Function Tests: Recent Labs  Lab 05/27/18 1713 05/28/18 0449 05/29/18 0745  AST 69* 83* 644*  ALT 30 37 268*  ALKPHOS 72 58 55  BILITOT 1.7* 1.2 1.1  PROT 8.6* 7.0 6.9  ALBUMIN 4.2 3.5 3.4*   No results for input(s): LIPASE, AMYLASE in the last 168 hours. Recent Labs  Lab 05/27/18 1810  AMMONIA <9*   Coagulation profile No results for input(s): INR, PROTIME in the last 168 hours.  CBC: Recent Labs  Lab 05/27/18 1713 05/28/18 0449 05/29/18 0745  WBC 17.7* 13.8* 9.0  HGB 14.5 12.6 11.9*  HCT 44.7 39.8 36.3  MCV 85.3 87.3 85.2  PLT 334 277 249   Cardiac  Enzymes: No results for input(s): CKTOTAL, CKMB, CKMBINDEX, TROPONINI in the last 168 hours. BNP (last 3 results) No results for input(s): PROBNP in the last 8760 hours. CBG: Recent Labs  Lab 05/27/18 1726  GLUCAP 171*   D-Dimer: No results for input(s): DDIMER in the last 72 hours. Hgb A1c: Recent Labs    05/29/18 0745  HGBA1C 5.4   Lipid Profile: No results for input(s): CHOL, HDL, LDLCALC, TRIG, CHOLHDL, LDLDIRECT in the last 72 hours. Thyroid function studies: Recent Labs    05/27/18 2254  TSH 0.508   Anemia work up: Recent Labs    05/27/18 2254  VITAMINB12 130*   Sepsis Labs: Recent Labs  Lab 05/27/18 1713 05/28/18 0449 05/29/18 0745  WBC 17.7* 13.8* 9.0    Microbiology Recent Results (from the past 240 hour(s))  Culture, Urine     Status: Abnormal   Collection Time: 05/27/18  8:10 PM  Result Value Ref Range Status   Specimen Description   Final    URINE, RANDOM Performed at [facility], [address], [place], [place] [address]    Special Requests IN STERILE CONTAINER  Final   Culture MULTIPLE SPECIES PRESENT, SUGGEST RECOLLECTION (A)  Final   Report Status 05/29/2018 FINAL  Final    Procedures and diagnostic studies:  Dg Chest 2 View  Result Date: 05/27/2018 CLINICAL DATA:  Altered mental status. EXAM: CHEST - 2 VIEW COMPARISON:  02/01/2011. FINDINGS: Poor inspiration. Interval ill-defined opacity at the left lung base anteriorly. Mildly prominent interstitial markings, accentuated by the poor inspiration. Diffuse osteopenia. IMPRESSION: 1. Poor inspiration with otherwise stable changes of COPD. 2. Increased density at the left lung base, most likely due to accentuation of an epicardial fat pad due to the poor inspiration. Electronically Signed   By: Beckie Salts M.D.   On: 05/27/2018 20:59   Ct Head Wo Contrast  Result Date: 05/27/2018 CLINICAL DATA:  Altered mental status. EXAM: CT HEAD WITHOUT CONTRAST TECHNIQUE: Contiguous axial images  were obtained from the base of the skull through the vertex without intravenous contrast. COMPARISON:  MR brain dated May 17, 2011. FINDINGS: Brain: No evidence of acute infarction, hemorrhage, hydrocephalus, extra-axial collection or mass lesion/mass effect. 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 Stable mild atrophy. Vascular: Atherosclerotic vascular calcification of the carotid siphons. No hyperdense vessel. Skull: Negative for fracture or focal lesion. Sinuses/Orbits: No acute finding. Other: None. IMPRESSION: 1.  No acute intracranial abnormality. Electronically Signed   By: Obie Dredge M.D.   On: 05/27/2018 19:06   Mr Brain Wo Contrast  Result Date: 05/28/2018 CLINICAL DATA:  Initial evaluation for acute altered mental status. EXAM: MRI HEAD WITHOUT CONTRAST TECHNIQUE: Multiplanar, multiecho pulse sequences of the brain and  surrounding structures were obtained without intravenous contrast. COMPARISON:  Prior CT from 05/27/2018 as well as previous MRI from 05/17/2011 FINDINGS: Brain: Diffuse prominence of the CSF containing spaces compatible with generalized age-related cerebral atrophy. Patchy and confluent T2/FLAIR hyperintensity within the periventricular and deep white matter both cerebral hemispheres, most consistent with chronic small vessel ischemic change, overall mild in nature. Encephalomalacia with gliosis within the anterior right frontal lobe consistent with remote right MCA territory infarct. No evidence for acute or subacute ischemia. No other areas of remote cortical infarction. No acute or chronic intracranial hemorrhage. No mass lesion, midline shift or mass effect. No hydrocephalus. No extra-axial fluid collection. Major dural sinuses grossly patent. Pituitary gland within normal limits. Vascular: Major intracranial vascular flow voids maintained. Skull and upper cervical spine: Craniocervical junction within normal limits. Upper cervical spine normal. Bone marrow signal  intensity within normal limits. No scalp soft tissue abnormality. Sinuses/Orbits: Globes and orbital soft tissues demonstrate no acute finding. Paranasal sinuses are clear. No significant mastoid effusion. Inner ear structures grossly normal. Other: None. IMPRESSION: 1. No acute intracranial abnormality. 2. Mild age-related cerebral atrophy with chronic small vessel ischemic disease. Remote anterior right frontal infarct. Electronically Signed   By: Rise MuBenjamin  McClintock M.D.   On: 05/28/2018 02:57   [place] Renal  Result Date: 05/28/2018 CLINICAL DATA:  67 year old female with hypertension and acute renal failure. EXAM: RENAL / URINARY TRACT ULTRASOUND COMPLETE COMPARISON:  None. FINDINGS: Right Kidney: Length: 10.1 cm. Echogenicity within normal limits. No hydronephrosis or shadowing stone. Left Kidney: Length: 10.3 cm. Echogenicity within normal limits. No hydronephrosis or shadowing stone. Bladder: Appears normal for degree of bladder distention. IMPRESSION: Unremarkable renal ultrasound. Electronically Signed   By: Elgie CollardArash  Radparvar M.D.   On: 05/28/2018 00:39   [place] Abdomen Limited Ruq  Result Date: 05/28/2018 CLINICAL DATA:  Abnormal LFTs EXAM: ULTRASOUND ABDOMEN LIMITED RIGHT UPPER QUADRANT COMPARISON:  None. FINDINGS: Gallbladder: No gallstones or wall thickening visualized. No sonographic Murphy sign noted by sonographer. Common bile duct: Diameter: 6.8 mm Liver: No focal lesion identified. Within normal limits in parenchymal echogenicity. Portal vein is patent on color Doppler imaging with normal direction of blood flow towards the liver. IMPRESSION: Normal right upper quadrant ultrasound Electronically Signed   By: Judie PetitM.  Shick M.D.   On: 05/28/2018 08:26    Medications:   . chlorhexidine  15 mL Mouth Rinse BID  . enoxaparin (LOVENOX) injection  30 mg Subcutaneous Q24H  . [START ON 05/30/2018] famotidine  20 mg Oral Daily  . folic acid  1 mg Oral Daily  . mouth rinse  15 mL Mouth Rinse q12n4p  .  multivitamin with minerals  1 tablet Oral Daily  . OLANZapine  2.5 mg Oral QHS  . thiamine  100 mg Oral Daily   Or  . thiamine  100 mg Intravenous Daily  . vitamin B-12  1,000 mcg Oral Daily   Continuous Infusions:    LOS: 1 day   Joseph ArtJessica U Bladimir Auman  [facility]   *Please refer to [email or web address], password [email or web address] to get updated schedule on who will round on this patient, as hospitalists switch teams weekly. If 7PM-7AM, please contact night-coverage at [email or web address], password [email or web address] for any overnight needs.  05/29/2018, 2:48 PM

## 2018-05-30 DIAGNOSIS — F10231 Alcohol dependence with withdrawal delirium: Secondary | ICD-10-CM

## 2018-05-30 LAB — COMPREHENSIVE METABOLIC PANEL
ALT: 160 U/L — ABNORMAL HIGH (ref 0–44)
ANION GAP: 6 (ref 5–15)
AST: 159 U/L — AB (ref 15–41)
Albumin: 2.9 g/dL — ABNORMAL LOW (ref 3.5–5.0)
Alkaline Phosphatase: 50 U/L (ref 38–126)
BILIRUBIN TOTAL: 1 mg/dL (ref 0.3–1.2)
BUN: 20 mg/dL (ref 8–23)
CHLORIDE: 111 mmol/L (ref 98–111)
CO2: 21 mmol/L — ABNORMAL LOW (ref 22–32)
Calcium: 8.8 mg/dL — ABNORMAL LOW (ref 8.9–10.3)
Creatinine, Ser: 1.28 mg/dL — ABNORMAL HIGH (ref 0.44–1.00)
GFR calc Af Amer: 49 mL/min — ABNORMAL LOW (ref >60)
GFR, EST NON AFRICAN AMERICAN: 42 mL/min — AB (ref >60)
Glucose, Bld: 91 mg/dL (ref 70–99)
POTASSIUM: 4 mmol/L (ref 3.5–5.1)
Sodium: 138 mmol/L (ref 135–145)
TOTAL PROTEIN: 5.9 g/dL — AB (ref 6.5–8.1)

## 2018-05-30 LAB — CBC
HEMATOCRIT: 35.2 % — AB (ref 36.0–46.0)
Hemoglobin: 11.1 g/dL — ABNORMAL LOW (ref 12.0–15.0)
MCH: 27.3 pg (ref 26.0–34.0)
MCHC: 31.5 g/dL (ref 30.0–36.0)
MCV: 86.5 fL (ref 78.0–100.0)
Platelets: 229 10*3/uL (ref 150–400)
RBC: 4.07 MIL/uL (ref 3.87–5.11)
RDW: 16.5 % — AB (ref 11.5–15.5)
WBC: 5.8 10*3/uL (ref 4.0–10.5)

## 2018-05-30 LAB — MAGNESIUM: Magnesium: 2.5 mg/dL — ABNORMAL HIGH (ref 1.7–2.4)

## 2018-05-30 MED ORDER — ENOXAPARIN SODIUM 40 MG/0.4ML ~~LOC~~ SOLN
40.0000 mg | SUBCUTANEOUS | Status: DC
  Administered 2018-05-31: 40 mg via SUBCUTANEOUS
  Filled 2018-05-30: qty 0.4

## 2018-05-30 NOTE — Clinical Social Work Note (Signed)
Clinical Social Work Assessment  Patient Details  Name: [name] MRN: [id number] Date of Birth: 24-Sep-1951  Date of referral:  05/30/18               Reason for consult:  Substance Use/ETOH Abuse                Permission sought to share information with:  Chartered certified accountant granted to share information::  Yes, Verbal Permission Granted  Name::     Karena Addison  Agency::     Relationship::  friend  Contact Information:     Housing/Transportation Living arrangements for the past 2 months:  (hotel, friends house) Source of Information:  Patient Patient Interpreter Needed:  None Criminal Activity/Legal Involvement Pertinent to Current Situation/Hospitalization:  No - Comment as needed Significant Relationships:  Siblings, Friend Lives with:  Self Do you feel safe going back to the place where you live?  No Need for family participation in patient care:  No (Coment)  Care giving concerns:  None at this time   Facilities manager / plan:  CSW met with pt to discuss alcohol rehab programs- per report pt interested in going straight into rehab.  Pt reports that she is not normally a drinker but that she was drinking about a fifth a day leading up to this admission.  Pt reports that she was drinking due to stress of selling her house.  Pt states she decided to sell her house for some reason and then began regretting it when she was already under contract with someone who had sold their house- felt as if she couldn't back out.  Pt states she coped with this stress by drinking and seeking prescribed medications.    Pt interested in rehab at this time but more concerned about where she is going to go at time of DC.  States she has friends who she could stay with but doesn't have their number since she lost her phone.  CSW offered to help find friend using [employer] site- pt in agreement.  CSW able to contact friend Karena Addison who was concerned about pt and  glad to know where she is- pt spoke with Vaughan Basta and Vaughan Basta willing to help her out.  Employment status:  Retired Forensic scientist:  Medicare PT Recommendations:  Not assessed at this time Information / Referral to community resources:  Residential Substance Abuse Treatment Options, Outpatient Substance Abuse Treatment Options  Patient/Family's Response to care:  Pt agreeable to rehab resources and very grateful for CSW help in contacting friend.  Patient/Family's Understanding of and Emotional Response to Diagnosis, Current Treatment, and Prognosis:  Unclear level of understanding by patient at this time- she is agreeable to rehab but not admitting she has a problem states she never was a drinker prior to this incident.  Emotional Assessment Appearance:  Appears stated age Attitude/Demeanor/Rapport:  Inconsistent Affect (typically observed):    Orientation:  Oriented to  Time, Oriented to Situation, Oriented to Place, Oriented to Self Alcohol / Substance use:  Alcohol Use Psych involvement (Current and /or in the community):  Yes (Comment)(consult)  Discharge Needs  Concerns to be addressed:  Care Coordination, Substance Abuse Concerns Readmission within the last 30 days:  No Current discharge risk:  Substance Abuse Barriers to Discharge:  Continued Medical Work up   Jorge Ny, LCSW 05/30/2018, 1:58 PM

## 2018-05-30 NOTE — Progress Notes (Signed)
Patient walked around the whole unit with sitter. Pt was stable throughout the walk. Pt tolerated the walk well. Will continue to monitor.

## 2018-05-30 NOTE — Progress Notes (Signed)
Progress Note    Briana RuedSheila Nees  HQI:[id number] DOB: 05/23/1951  DOA: 05/27/2018 PCP: No primary care provider on file.    Brief Narrative:    Medical records reviewed and are as summarized below:  [name] is an 67 y.o. female w hypertension, hyperlipidemia, pvd,  apparently presents due to being found in hotel room barricaded.  Pt was acting strange.   Told nurse she drinks alcohol-- 1 pint/day.  Mental status improved with treatment of withdrawal-- social work looking at alcohol treatment options otherwise, patient will go to live with brothers.    Assessment/Plan:   Principal Problem:   Altered mental status Active Problems:   Abnormal liver function   ARF (acute renal failure) (HCC)  AMS with hallucinations-- ? Due to alcohol withdrawal -B12 low-- replacing PO as she refused IM -psych consult for auditory hallucinations-- resolved but due to alcohol withdrawal -zyprexa d/c'd due to long Qtc -MRI with chronic changes-- no acute issue -resolved AMS and no further sign of withdrawal-- patient is hard of hearing but can read lips  doubt UTI D/c rocephin Culture with multiple species  AKI on CKD stage 3 -last Cr was 1.5 --- 6 years ago -trending down  B12 deficiency Started vitamin b12 PO-- refused injection  PVD (long segment occulsion of right external iliac artery, common femoral artery and long segment occulsion of the right superficial femoral artery s/p thrombectomy of the external iliac, and common femoral artery along with profunda femoral artery , and patch angioplasty by Fabienne Brunsharles Fields in 2012 ) -not on any medications  Abnormal liver function- elevated AST/ALT -? Alcohol abuse -trending down -RUQ U/S normal -hepatitis panel negative  Hypokalemia/hypomagnesemia -repleted  Alcohol abuse -no further signs of withdrawal      Family Communication/Anticipated D/C date and plan/Code Status   DVT prophylaxis: Lovenox ordered. Code  Status: Full Code.  Family Communication: called brother-- has not spoken to patient in last 6 months 6/25 Disposition Plan: d/c in AM   Medical Consultants:    psych    Subjective:   Would like to go to rehab for alcohol otherwise, plans to go live with a brother-- [place]   Objective:    Vitals:   05/29/18 2055 05/30/18 0554 05/30/18 0820 05/30/18 1154  BP: (!) 154/71 (!) 155/79 (!) 152/80 (!) 131/55  Pulse: 73 61 68 77  Resp: 18 16 18 18   Temp: 98 F (36.7 C) 97.6 F (36.4 C) 98.6 F (37 C) 98 F (36.7 C)  TempSrc: Oral Oral Oral Oral  SpO2: 100% 98% 98% 97%  Weight:      Height:        Intake/Output Summary (Last 24 hours) at 05/30/2018 1351 Last data filed at 05/30/2018 1100 Gross per 24 hour  Intake 720 ml  Output -  Net 720 ml   Filed Weights   05/27/18 1658 05/29/18 0423  Weight: 68 kg (150 lb) 70.8 kg (156 lb 1.4 oz)    Exam: A+Ox3, NAD No LE edema Steady gait rrr   Data Reviewed:   I have personally reviewed following labs and imaging studies:  Labs: Labs show the following:   Basic Metabolic Panel: Recent Labs  Lab 05/27/18 1713 05/28/18 0449 05/29/18 0745 05/30/18 0424  NA 142 141 137 138  K 3.7 4.0 3.0* 4.0  CL 104 110 110 111  CO2 21* 15* 18* 21*  GLUCOSE 188* 120* 91 91  BUN 25* 22* 25* 20  CREATININE  2.60* 2.00* 1.94* 1.28*  CALCIUM 10.3 6.9* 8.5* 8.8*  MG  --  1.4*  --  2.5*   GFR Estimated Creatinine Clearance: 39.9 mL/min (A) (by C-G formula based on SCr of 1.28 mg/dL (H)). Liver Function Tests: Recent Labs  Lab 05/27/18 1713 05/28/18 0449 05/29/18 0745 05/30/18 0424  AST 69* 83* 644* 159*  ALT 30 37 268* 160*  ALKPHOS 72 58 55 50  BILITOT 1.7* 1.2 1.1 1.0  PROT 8.6* 7.0 6.9 5.9*  ALBUMIN 4.2 3.5 3.4* 2.9*   No results for input(s): LIPASE, AMYLASE in the last 168 hours. Recent Labs  Lab 05/27/18 1810  AMMONIA <9*   Coagulation profile No results for input(s): INR, PROTIME  in the last 168 hours.  CBC: Recent Labs  Lab 05/27/18 1713 05/27/18 2254 05/28/18 0449 05/29/18 0745 05/30/18 0424  WBC 17.7*  --  13.8* 9.0 5.8  HGB 14.5  --  12.6 11.9* 11.1*  HCT 44.7 43.5 39.8 36.3 35.2*  MCV 85.3  --  87.3 85.2 86.5  PLT 334  --  277 249 229   Cardiac Enzymes: No results for input(s): CKTOTAL, CKMB, CKMBINDEX, TROPONINI in the last 168 hours. BNP (last 3 results) No results for input(s): PROBNP in the last 8760 hours. CBG: Recent Labs  Lab 05/27/18 1726  GLUCAP 171*   D-Dimer: No results for input(s): DDIMER in the last 72 hours. Hgb A1c: Recent Labs    05/29/18 0745  HGBA1C 5.4   Lipid Profile: No results for input(s): CHOL, HDL, LDLCALC, TRIG, CHOLHDL, LDLDIRECT in the last 72 hours. Thyroid function studies: Recent Labs    05/27/18 2254  TSH 0.508   Anemia work up: Recent Labs    05/27/18 2254  VITAMINB12 130*   Sepsis Labs: Recent Labs  Lab 05/27/18 1713 05/28/18 0449 05/29/18 0745 05/30/18 0424  WBC 17.7* 13.8* 9.0 5.8    Microbiology Recent Results (from the past 240 hour(s))  Culture, Urine     Status: Abnormal   Collection Time: 05/27/18  8:10 PM  Result Value Ref Range Status   Specimen Description   Final    URINE, RANDOM Performed at [facility], [address] [place], [place] [address]    Special Requests IN STERILE CONTAINER  Final   Culture MULTIPLE SPECIES PRESENT, SUGGEST RECOLLECTION (A)  Final   Report Status 05/29/2018 FINAL  Final    Procedures and diagnostic studies:  No results found.  Medications:   . chlorhexidine  15 mL Mouth Rinse BID  . [START ON 05/31/2018] enoxaparin (LOVENOX) injection  40 mg Subcutaneous Q24H  . famotidine  20 mg Oral Daily  . folic acid  1 mg Oral Daily  . mouth rinse  15 mL Mouth Rinse q12n4p  . multivitamin with minerals  1 tablet Oral Daily  . OLANZapine  2.5 mg Oral QHS  . thiamine  100 mg Oral Daily   Or  . thiamine  100 mg Intravenous Daily  .  vitamin B-12  1,000 mcg Oral Daily   Continuous Infusions:    LOS: 2 days   Joseph Art  [facility]   *Please refer to [email or web address], password [email or web address] to get updated schedule on who will round on this patient, as hospitalists switch teams weekly. If 7PM-7AM, please contact night-coverage at [email or web address], password [email or web address] for any overnight needs.  05/30/2018, 1:51 PM

## 2018-05-31 DIAGNOSIS — R4182 Altered mental status, unspecified: Secondary | ICD-10-CM

## 2018-05-31 DIAGNOSIS — N179 Acute kidney failure, unspecified: Secondary | ICD-10-CM

## 2018-05-31 DIAGNOSIS — R945 Abnormal results of liver function studies: Secondary | ICD-10-CM

## 2018-05-31 LAB — BASIC METABOLIC PANEL
ANION GAP: 6 (ref 5–15)
BUN: 18 mg/dL (ref 8–23)
CHLORIDE: 112 mmol/L — AB (ref 98–111)
CO2: 20 mmol/L — AB (ref 22–32)
CREATININE: 0.92 mg/dL (ref 0.44–1.00)
Calcium: 8.7 mg/dL — ABNORMAL LOW (ref 8.9–10.3)
GFR calc non Af Amer: >60 mL/min (ref >60)
GLUCOSE: 97 mg/dL (ref 70–99)
Potassium: 3.7 mmol/L (ref 3.5–5.1)
Sodium: 138 mmol/L (ref 135–145)

## 2018-05-31 LAB — CBC
HEMATOCRIT: 34.9 % — AB (ref 36.0–46.0)
HEMOGLOBIN: 11.2 g/dL — AB (ref 12.0–15.0)
MCH: 27.6 pg (ref 26.0–34.0)
MCHC: 32.1 g/dL (ref 30.0–36.0)
MCV: 86 fL (ref 78.0–100.0)
Platelets: 219 10*3/uL (ref 150–400)
RBC: 4.06 MIL/uL (ref 3.87–5.11)
RDW: 16.2 % — AB (ref 11.5–15.5)
WBC: 6.1 10*3/uL (ref 4.0–10.5)

## 2018-05-31 MED ORDER — POLYETHYLENE GLYCOL 3350 17 G PO PACK: 17.0000 g | PACK | Freq: Every day | ORAL | 0 refills | Status: AC | PRN

## 2018-05-31 MED ORDER — POLYETHYLENE GLYCOL 3350 17 G PO PACK: 17.0000 g | PACK | Freq: Every day | ORAL | Status: DC

## 2018-05-31 MED ORDER — FAMOTIDINE 20 MG PO TABS: 20.0000 mg | ORAL_TABLET | Freq: Every day | ORAL | 0 refills | Status: AC

## 2018-05-31 MED ORDER — THIAMINE HCL 100 MG PO TABS: 100.0000 mg | ORAL_TABLET | Freq: Every day | ORAL | 0 refills | Status: AC

## 2018-05-31 MED ORDER — CYANOCOBALAMIN 1000 MCG PO TABS: 1000.0000 ug | ORAL_TABLET | Freq: Every day | ORAL | 0 refills | Status: AC

## 2018-05-31 NOTE — Care Management Important Message (Signed)
Important Message  Patient Details  Name: Briana RuedSheila Altadonna MRN: [id number] Date of Birth: 03/02/1951   Medicare Important Message Given:  Yes    Aaralyn Kil 05/31/2018, 2:25 PM

## 2018-05-31 NOTE — Discharge Summary (Signed)
Physician Discharge Summary  Briana RuedSheila Hall GMW:[id number] DOB: 10/07/1951 DOA: 05/27/2018  PCP: No primary care provider on file.  Admit date: 05/27/2018 Discharge date: 05/31/2018  Time spent: 35 minutes  Recommendations for Outpatient Follow-up:  1. PCP in 1 week 2. Outpatient substance abuse resources given 3. Vascular surgery Dr. Darrick PennaFields in 1-2 months   Discharge Diagnoses:    Alcohol abuse   Hallucinations   Alcohol withdrawal   Abnormal liver function   ARF (acute renal failure) (HCC)   AKI (acute kidney injury) (HCC)   B-12 deficiency   History peripheral vascular disease  Discharge Condition: stable  Diet recommendation: heart healthy  Filed Weights   05/27/18 1658 05/29/18 0423 05/31/18 0421  Weight: 68 kg (150 lb) 70.8 kg (156 lb 1.4 oz) 73.7 kg (162 lb 7.7 oz)    History of present illness:  Briana RuedSheila Hall is an 67 y.o. female w hypertension, hyperlipidemia, pvd, apparently presents due to being found in hotel room barricaded. Pt was acting strange and drinking heavily recently due to stress  Hospital Course:   Acute alcohol withdrawal with hallucinations -due to alcohol withdrawal -Improved with benzodiazepines and supportive care -Also had an MRI brain on admission which only noted chronic changes without any acute abnormalities -B-12 level was found to be low, started oral replacement, patient declined IM B-12 shots -clinically improved and stable at this time - alcohol/substance abuse resources given by the Child psychotherapistsocial worker, she plans to follow-up as outpatient  AKI on CKD stage 3 -last Cr was 1.5 --- 6 years ago -creatinine improved to normal range with hydration  B12 deficiency Started vitamin b12 PO-- refused injection  PVD (long segment occulsion of right external iliac artery, common femoral artery and long segment occulsion of the right superficial femoral artery s/p thrombectomy of the external iliac, and common femoral artery along with  profunda femoral artery , and patch angioplasty by Fabienne Brunsharles Fields in 2012 ) -stable, not active at this time, not on any medications -advised to follow-up with Dr. Darrick Pennafields  Mild alcoholic hepatitis -Improving, Alcohol cessation counseled  Hypokalemia/hypomagnesemia -repleted  Alcohol abuse -no further signs of withdrawal -seen by the Child psychotherapistsocial worker, resources given for substance abuse   Consultations:  Social worker  Discharge Exam: Vitals:   05/31/18 0758 05/31/18 1124  BP: (!) 114/57 120/85  Pulse: 68 75  Resp: 16 20  Temp: 97.8 F (36.6 C) 97.6 F (36.4 C)  SpO2: 92% 94%    General: AAOx3 Cardiovascular: S1S2/RRR Respiratory: CTAB  Discharge Instructions   Discharge Instructions    Diet - low sodium heart healthy   Complete by:  As directed    Increase activity slowly   Complete by:  As directed      Allergies as of 05/31/2018   No Known Allergies     Medication List    TAKE these medications   cyanocobalamin 1000 MCG tablet Take 1 tablet (1,000 mcg total) by mouth daily. Start taking on:  06/01/2018   famotidine 20 MG tablet Commonly known as:  PEPCID Take 1 tablet (20 mg total) by mouth daily. Start taking on:  06/01/2018   polyethylene glycol packet Commonly known as:  MIRALAX / GLYCOLAX Take 17 g by mouth daily as needed for mild constipation.   thiamine 100 MG tablet Take 1 tablet (100 mg total) by mouth daily. Start taking on:  06/01/2018      No Known Allergies    The results of significant diagnostics from this hospitalization (including imaging, microbiology,  ancillary and laboratory) are listed below for reference.    Significant Diagnostic Studies: Dg Chest 2 View  Result Date: 05/27/2018 CLINICAL DATA:  Altered mental status. EXAM: CHEST - 2 VIEW COMPARISON:  02/01/2011. FINDINGS: Poor inspiration. Interval ill-defined opacity at the left lung base anteriorly. Mildly prominent interstitial markings, accentuated by the poor  inspiration. Diffuse osteopenia. IMPRESSION: 1. Poor inspiration with otherwise stable changes of COPD. 2. Increased density at the left lung base, most likely due to accentuation of an epicardial fat pad due to the poor inspiration. Electronically Signed   By: Beckie Salts M.D.   On: 05/27/2018 20:59   Ct Head Wo Contrast  Result Date: 05/27/2018 CLINICAL DATA:  Altered mental status. EXAM: CT HEAD WITHOUT CONTRAST TECHNIQUE: Contiguous axial images were obtained from the base of the skull through the vertex without intravenous contrast. COMPARISON:  MR brain dated May 17, 2011. FINDINGS: Brain: No evidence of acute infarction, hemorrhage, hydrocephalus, extra-axial collection or mass lesion/mass effect. 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 Stable mild atrophy. Vascular: Atherosclerotic vascular calcification of the carotid siphons. No hyperdense vessel. Skull: Negative for fracture or focal lesion. Sinuses/Orbits: No acute finding. Other: None. IMPRESSION: 1.  No acute intracranial abnormality. Electronically Signed   By: Obie Dredge M.D.   On: 05/27/2018 19:06   Mr Brain Wo Contrast  Result Date: 05/28/2018 CLINICAL DATA:  Initial evaluation for acute altered mental status. EXAM: MRI HEAD WITHOUT CONTRAST TECHNIQUE: Multiplanar, multiecho pulse sequences of the brain and surrounding structures were obtained without intravenous contrast. COMPARISON:  Prior CT from 05/27/2018 as well as previous MRI from 05/17/2011 FINDINGS: Brain: Diffuse prominence of the CSF containing spaces compatible with generalized age-related cerebral atrophy. Patchy and confluent T2/FLAIR hyperintensity within the periventricular and deep white matter both cerebral hemispheres, most consistent with chronic small vessel ischemic change, overall mild in nature. Encephalomalacia with gliosis within the anterior right frontal lobe consistent with remote right MCA territory infarct. No evidence for acute or subacute  ischemia. No other areas of remote cortical infarction. No acute or chronic intracranial hemorrhage. No mass lesion, midline shift or mass effect. No hydrocephalus. No extra-axial fluid collection. Major dural sinuses grossly patent. Pituitary gland within normal limits. Vascular: Major intracranial vascular flow voids maintained. Skull and upper cervical spine: Craniocervical junction within normal limits. Upper cervical spine normal. Bone marrow signal intensity within normal limits. No scalp soft tissue abnormality. Sinuses/Orbits: Globes and orbital soft tissues demonstrate no acute finding. Paranasal sinuses are clear. No significant mastoid effusion. Inner ear structures grossly normal. Other: None. IMPRESSION: 1. No acute intracranial abnormality. 2. Mild age-related cerebral atrophy with chronic small vessel ischemic disease. Remote anterior right frontal infarct. Electronically Signed   By: Rise Mu M.D.   On: 05/28/2018 02:57   US Renal  Result Date: 05/28/2018 CLINICAL DATA:  67 year old female with hypertension and acute renal failure. EXAM: RENAL / URINARY TRACT ULTRASOUND COMPLETE COMPARISON:  None. FINDINGS: Right Kidney: Length: 10.1 cm. Echogenicity within normal limits. No hydronephrosis or shadowing stone. Left Kidney: Length: 10.3 cm. Echogenicity within normal limits. No hydronephrosis or shadowing stone. Bladder: Appears normal for degree of bladder distention. IMPRESSION: Unremarkable renal ultrasound. Electronically Signed   By: Elgie Collard M.D.   On: 05/28/2018 00:39   US Abdomen Limited Ruq  Result Date: 05/28/2018 CLINICAL DATA:  Abnormal LFTs EXAM: ULTRASOUND ABDOMEN LIMITED RIGHT UPPER QUADRANT COMPARISON:  None. FINDINGS: Gallbladder: No gallstones or wall thickening visualized. No sonographic Murphy sign noted by sonographer. Common  bile duct: Diameter: 6.8 mm Liver: No focal lesion identified. Within normal limits in parenchymal echogenicity. Portal vein is  patent on color Doppler imaging with normal direction of blood flow towards the liver. IMPRESSION: Normal right upper quadrant ultrasound Electronically Signed   By: Judie Petit.  Shick M.D.   On: 05/28/2018 08:26    Microbiology: Recent Results (from the past 240 hour(s))  Culture, Urine     Status: Abnormal   Collection Time: 05/27/18  8:10 PM  Result Value Ref Range Status   Specimen Description   Final    URINE, RANDOM Performed at [facility], [address], [place], [place] [address]    Special Requests IN STERILE CONTAINER  Final   Culture MULTIPLE SPECIES PRESENT, SUGGEST RECOLLECTION (A)  Final   Report Status 05/29/2018 FINAL  Final     Labs: Basic Metabolic Panel: Recent Labs  Lab 05/27/18 1713 05/28/18 0449 05/29/18 0745 05/30/18 0424 05/31/18 0352  NA 142 141 137 138 138  K 3.7 4.0 3.0* 4.0 3.7  CL 104 110 110 111 112*  CO2 21* 15* 18* 21* 20*  GLUCOSE 188* 120* 91 91 97  BUN 25* 22* 25* 20 18  CREATININE 2.60* 2.00* 1.94* 1.28* 0.92  CALCIUM 10.3 6.9* 8.5* 8.8* 8.7*  MG  --  1.4*  --  2.5*  --    Liver Function Tests: Recent Labs  Lab 05/27/18 1713 05/28/18 0449 05/29/18 0745 05/30/18 0424  AST 69* 83* 644* 159*  ALT 30 37 268* 160*  ALKPHOS 72 58 55 50  BILITOT 1.7* 1.2 1.1 1.0  PROT 8.6* 7.0 6.9 5.9*  ALBUMIN 4.2 3.5 3.4* 2.9*   No results for input(s): LIPASE, AMYLASE in the last 168 hours. Recent Labs  Lab 05/27/18 1810  AMMONIA <9*   CBC: Recent Labs  Lab 05/27/18 1713 05/27/18 2254 05/28/18 0449 05/29/18 0745 05/30/18 0424 05/31/18 0352  WBC 17.7*  --  13.8* 9.0 5.8 6.1  HGB 14.5  --  12.6 11.9* 11.1* 11.2*  HCT 44.7 43.5 39.8 36.3 35.2* 34.9*  MCV 85.3  --  87.3 85.2 86.5 86.0  PLT 334  --  277 249 229 219   Cardiac Enzymes: No results for input(s): CKTOTAL, CKMB, CKMBINDEX, TROPONINI in the last 168 hours. BNP: BNP (last 3 results) No results for input(s): BNP in the last 8760 hours.  ProBNP (last 3 results) No results  for input(s): PROBNP in the last 8760 hours.  CBG: Recent Labs  Lab 05/27/18 1726  GLUCAP 171*       Signed:  Zannie Cove MD.  [facility] 05/31/2018, 3:13 PM

## 2018-05-31 NOTE — Progress Notes (Signed)
Patient discharged home. Discharge instructions were reviewed with patient. Patient verbalized understanding.
